# Patient Record
Sex: Female | Born: 1995 | Race: Black or African American | Hispanic: No | Marital: Single | Smoking: Current some day smoker
Health system: Southern US, Community
[De-identification: ages and names within clinical notes are randomized; demographics above are authoritative.]

## PROBLEM LIST (undated history)

## (undated) DIAGNOSIS — L309 Dermatitis, unspecified: Secondary | ICD-10-CM

## (undated) DIAGNOSIS — Z8659 Personal history of other mental and behavioral disorders: Secondary | ICD-10-CM

## (undated) DIAGNOSIS — I10 Essential (primary) hypertension: Secondary | ICD-10-CM

## (undated) HISTORY — PX: NO PAST SURGERIES: SHX2092

---
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
## 2015-02-17 ENCOUNTER — Emergency Department (HOSPITAL_COMMUNITY): Payer: No Typology Code available for payment source

## 2015-02-17 ENCOUNTER — Emergency Department (HOSPITAL_COMMUNITY)
Admission: EM | Admit: 2015-02-17 | Discharge: 2015-02-17 | Disposition: A | Discharge status: Home / Self Care | Payer: No Typology Code available for payment source | Attending: Emergency Medicine | Admitting: Emergency Medicine

## 2015-02-17 ENCOUNTER — Encounter (HOSPITAL_COMMUNITY): Payer: Self-pay | Admitting: *Deleted

## 2015-02-17 DIAGNOSIS — Z872 Personal history of diseases of the skin and subcutaneous tissue: Secondary | ICD-10-CM | POA: Diagnosis not present

## 2015-02-17 DIAGNOSIS — R059 Cough, unspecified: Secondary | ICD-10-CM

## 2015-02-17 DIAGNOSIS — Z7952 Long term (current) use of systemic steroids: Secondary | ICD-10-CM | POA: Diagnosis not present

## 2015-02-17 DIAGNOSIS — R05 Cough: Secondary | ICD-10-CM

## 2015-02-17 DIAGNOSIS — J029 Acute pharyngitis, unspecified: Secondary | ICD-10-CM | POA: Diagnosis not present

## 2015-02-17 DIAGNOSIS — F909 Attention-deficit hyperactivity disorder, unspecified type: Secondary | ICD-10-CM | POA: Insufficient documentation

## 2015-02-17 DIAGNOSIS — Z7951 Long term (current) use of inhaled steroids: Secondary | ICD-10-CM | POA: Diagnosis not present

## 2015-02-17 DIAGNOSIS — J45901 Unspecified asthma with (acute) exacerbation: Secondary | ICD-10-CM | POA: Diagnosis not present

## 2015-02-17 DIAGNOSIS — R509 Fever, unspecified: Secondary | ICD-10-CM

## 2015-02-17 LAB — RAPID STREP SCREEN (MED CTR MEBANE ONLY): Streptococcus, Group A Screen (Direct): NEGATIVE

## 2015-02-17 MED ORDER — ACETAMINOPHEN 500 MG PO TABS
1000.0000 mg | ORAL_TABLET | Freq: Once | ORAL | Status: AC
  Administered 2015-02-17: 1000 mg via ORAL
  Filled 2015-02-17: qty 2

## 2015-02-17 MED ORDER — DEXTROMETHORPHAN POLISTIREX 30 MG/5ML PO LQCR: 30.0000 mg | ORAL | Status: DC | PRN

## 2015-02-17 NOTE — Discharge Instructions (Signed)
Rapid strep and CXR were normal today. Take the prescribed medication as directed. Follow-up with your primary care physician. Return to the ED for new or worsening symptoms.

## 2015-02-17 NOTE — ED Provider Notes (Signed)
CSN: [id number]     Arrival date & time 02/17/15  0807 History   First MD Initiated Contact with Patient 02/17/15 0820     Chief Complaint  Patient presents with  . Sore Throat  . Cough     (Consider location/radiation/quality/duration/timing/severity/associated sxs/prior Treatment) Patient is a 19 y.o. female presenting with pharyngitis and cough. The history is provided by the patient and medical records.  Sore Throat Associated symptoms include congestion, coughing and a sore throat.  Cough Associated symptoms: rhinorrhea and sore throat     This is an 19 year old female with past medical history significant for asthma, eczema, ADHD, presenting to the ED for URI type symptoms. Specifically patient has had dry cough, sore throat, nasal congestion, and intermittent headaches for the past 5 days. She states her younger brother is sick at home with similar symptoms. She endorses subjective fever and chills and generalized malaise. She denies any chest pain, shortness of breath, nausea, vomiting, abdominal pain.  No intervention tried PTA.  Past Medical History  Diagnosis Date  . Asthma   . History of ADHD   . Eczema    History reviewed. No pertinent past surgical history. Family History  Problem Relation Age of Onset  . Stroke Other   . Hypertension Other   . Cancer Other   . Diabetes Other   . Hypertension Father    History  Substance Use Topics  . Smoking status: Never Smoker   . Smokeless tobacco: Not on file  . Alcohol Use: No   OB History    No data available     Review of Systems  HENT: Positive for congestion, rhinorrhea and sore throat.   Respiratory: Positive for cough.   All other systems reviewed and are negative.     Allergies  Other  Home Medications   Prior to Admission medications   Medication Sig Start Date End Date Taking? Authorizing Provider  amphetamine-dextroamphetamine (ADDERALL XR) 5 MG 24 hr capsule Take 5 mg by mouth every morning.     Historical Provider, MD  fluticasone (FLONASE) 50 MCG/ACT nasal spray Place 2 sprays into the nose daily. 03/20/12 03/20/13  Delanna Noticeonald Laney, MD  medroxyPROGESTERone (DEPO-PROVERA) 150 MG/ML injection Inject 150 mg into the muscle every 3 (three) months.    Historical Provider, MD  triamcinolone cream (KENALOG) 0.1 % Apply 1 application topically 2 (two) times daily.    Historical Provider, MD   BP 109/69 mmHg  Pulse 107  Temp(Src) 99.3 F (37.4 C) (Oral)  Resp 20  Wt 135 lb (61.236 kg)  SpO2 96%   Physical Exam  Constitutional: She is oriented to person, place, and time. She appears well-developed and well-nourished.  Non-toxic appearance. No distress.  HENT:  Head: Normocephalic and atraumatic.  Right Ear: Tympanic membrane and ear canal normal.  Left Ear: Tympanic membrane and ear canal normal.  Nose: Mucosal edema present.  Mouth/Throat: Uvula is midline and mucous membranes are normal. No oral lesions. No trismus in the jaw. Posterior oropharyngeal erythema present. No oropharyngeal exudate, posterior oropharyngeal edema or tonsillar abscesses.  Nasal congestion and PND noted; tonsils 1+ bilaterally without exudate; uvula midline without peritonsillar abscess; handling secretions appropriately; no difficulty swallowing or speaking  Eyes: Conjunctivae and EOM are normal. Pupils are equal, round, and reactive to light.  Neck: Normal range of motion.  Cardiovascular: Normal rate, regular rhythm and normal heart sounds.   Pulmonary/Chest: Effort normal and breath sounds normal. No respiratory distress. She has no wheezes. She has no  rhonchi.  Faint expiratory wheezes in right lung base; speaking in full complete sentences without difficulty  Abdominal: Soft. Bowel sounds are normal. There is no tenderness. There is no guarding.  Musculoskeletal: Normal range of motion.  Lymphadenopathy:    She has no cervical adenopathy.  Neurological: She is alert and oriented to person, place, and time.   Skin: Skin is warm and dry.  Psychiatric: She has a normal mood and affect.  Nursing note and vitals reviewed.   ED Course  Procedures (including critical care time) Labs Review Labs Reviewed  RAPID STREP SCREEN  CULTURE, GROUP A STREP    Imaging Review Dg Chest 2 View  02/17/2015   CLINICAL DATA:  Cough, sore throat, upper respiratory infection  EXAM: CHEST  2 VIEW  COMPARISON:  None.  FINDINGS: The heart size and mediastinal contours are within normal limits. Both lungs are clear. The visualized skeletal structures are unremarkable.  IMPRESSION: No active cardiopulmonary disease.   Electronically Signed   By: Natasha Mead M.D.   On: 02/17/2015 10:02     EKG Interpretation None      MDM   Final diagnoses:  Cough  Fever   19 year old female with upper respiratory symptoms for the past 5 days. On exam, patient with low-grade fever but overall nontoxic in appearance. She does have slight tonsillar erythema and postnasal drip noted. She is handling secretions well. She does have a faint expiratory wheeze in her right lower lobe, patient also has history of asthma. She is speaking in full complete sentences and is in no acute respiratory distress. Rapid strep negative, culture pending. Chest x-ray was obtained which is also negative for acute findings. Suspect viral process. Of note, patient with low-grade tachycardia, this is likely secondary to her fever. She has no chest pain or shortness of breath to suggest PE. Patient discharged home with supportive care. Encouraged close PCP follow-up.   Discussed plan with patient, he/she acknowledged understanding and agreed with plan of care.  Return precautions given for new or worsening symptoms.  Patient with increased temp at time of discharge.  She was given 1g tylenol prior to discharge.  Instructed to continue tylenol/motrin at home PRN fever.  PCP FU again encouraged.  Garlon Hatchet, PA-C 02/17/15 1049  Donnetta Hutching, MD 02/17/15  [phone]

## 2015-02-17 NOTE — ED Notes (Signed)
Declined W/C at D/C and was escorted to lobby by RN. 

## 2015-02-17 NOTE — ED Notes (Signed)
Pt reports gough ,sore throat and URI Sx's since [place].

## 2015-02-19 LAB — CULTURE, GROUP A STREP: STREP A CULTURE: NEGATIVE

## 2015-03-17 ENCOUNTER — Emergency Department (INDEPENDENT_AMBULATORY_CARE_PROVIDER_SITE_OTHER)
Admission: EM | Admit: 2015-03-17 | Discharge: 2015-03-17 | Disposition: A | Discharge status: Home / Self Care | Payer: Medicaid Other | Source: Home / Self Care | Attending: Family Medicine | Admitting: Family Medicine

## 2015-03-17 DIAGNOSIS — L02214 Cutaneous abscess of groin: Secondary | ICD-10-CM

## 2015-03-17 MED ORDER — SULFAMETHOXAZOLE-TRIMETHOPRIM 800-160 MG PO TABS: 2.0000 | ORAL_TABLET | Freq: Two times a day (BID) | ORAL | Status: DC

## 2015-03-17 NOTE — Discharge Instructions (Signed)
Thank you for coming in today.  Abscess Care After An abscess (also called a boil or furuncle) is an infected area that contains a collection of pus. Signs and symptoms of an abscess include pain, tenderness, redness, or hardness, or you may feel a moveable soft area under your skin. An abscess can occur anywhere in the body. The infection may spread to surrounding tissues causing cellulitis. A cut (incision) by the surgeon was made over your abscess and the pus was drained out. Gauze may have been packed into the space to provide a drain that will allow the cavity to heal from the inside outwards. The boil may be painful for 5 to 7 days. Most people with a boil do not have high fevers. Your abscess, if seen early, may not have localized, and may not have been lanced. If not, another appointment may be required for this if it does not get better on its own or with medications. HOME CARE INSTRUCTIONS   Only take over-the-counter or prescription medicines for pain, discomfort, or fever as directed by your caregiver.  When you bathe, soak and then remove gauze or iodoform packs at least daily or as directed by your caregiver. You may then wash the wound gently with mild soapy water. Repack with gauze or do as your caregiver directs. SEEK IMMEDIATE MEDICAL CARE IF:   You develop increased pain, swelling, redness, drainage, or bleeding in the wound site.  You develop signs of generalized infection including muscle aches, chills, fever, or a general ill feeling.  An oral temperature above 102 F (38.9 C) develops, not controlled by medication. See your caregiver for a recheck if you develop any of the symptoms described above. If medications (antibiotics) were prescribed, take them as directed. Document Released: 06/28/2005 Document Revised: 03/03/2012 Document Reviewed: 02/23/2008 ExitCare Patient Information 2015 [employer]. This information is not intended to replace advice given to you by your  health care provider. Make sure you discuss any questions you have with your health care provider.  

## 2015-03-17 NOTE — ED Provider Notes (Signed)
Regina Mckee is a 19 y.o. female who presents to Urgent Care today for abscess. Patient has a tender painful area in her left pubic area that has been present for about 4 days and is worsening. Symptoms started after patient shaved her pubic hair. No fevers or chills nausea vomiting or diarrhea.   Patient uses Depo-Provera for birth control. Past Medical History  Diagnosis Date  . Asthma   . History of ADHD   . Eczema    No past surgical history on file. History  Substance Use Topics  . Smoking status: Never Smoker   . Smokeless tobacco: Not on file  . Alcohol Use: No   ROS as above Medications: No current facility-administered medications for this encounter.   Current Outpatient Prescriptions  Medication Sig Dispense Refill  . amphetamine-dextroamphetamine (ADDERALL XR) 5 MG 24 hr capsule Take 5 mg by mouth every morning.    Marland Kitchen. dextromethorphan (DELSYM) 30 MG/5ML liquid Take 5 mLs (30 mg total) by mouth as needed for cough. 89 mL 0  . fluticasone (FLONASE) 50 MCG/ACT nasal spray Place 2 sprays into the nose daily. 16 g 2  . medroxyPROGESTERone (DEPO-PROVERA) 150 MG/ML injection Inject 150 mg into the muscle every 3 (three) months.    . sulfamethoxazole-trimethoprim (SEPTRA DS) 800-160 MG per tablet Take 2 tablets by mouth 2 (two) times daily. 28 tablet 0  . triamcinolone cream (KENALOG) 0.1 % Apply 1 application topically 2 (two) times daily.     Allergies  Allergen Reactions  . Other Itching    Cherries cause throat to itch.     Exam:  BP 116/80 mmHg  Pulse 84  Temp(Src) 98.2 F (36.8 C) (Oral)  Resp 18  SpO2 97% Gen: Well NAD Skin: Pubic mount tender erythematous indurated area left pubic mount with central fluctuance. Approximate 4 cm in diameter.   Abscess incision and drainage. Consent obtained and timeout performed. Skin cleaned with alcohol, and cold spray applied. 7 mL of lidocaine with epinephrine injected achieving good anesthesia. Skin was  again cleaned with alcohol. A sharp incision was made to the area of fluctuance. The incision was widened and pus was expressed. Pus was cultured. Blunt dissection was used to break up loculations. Further pus was expressed. Patient tolerated the procedure well. A dressing was applied  No results found for this or any previous visit (from the past 24 hour(s)). No results found.  Assessment and Plan: 19 y.o. female with abscess incised and drained. Culture pending. Treat with Bactrim.  Discussed warning signs or symptoms. Please see discharge instructions. Patient expresses understanding.     Rodolph BongEvan S Leeah Politano, MD 03/17/15 [phone]

## 2015-03-17 NOTE — ED Notes (Signed)
Reports having an abscess on her vagina x 4 days.  Pain and swelling.  No drainage.

## 2015-03-18 ENCOUNTER — Encounter (HOSPITAL_COMMUNITY): Payer: Self-pay | Admitting: Emergency Medicine

## 2015-03-18 ENCOUNTER — Emergency Department (HOSPITAL_COMMUNITY)
Admission: EM | Admit: 2015-03-18 | Discharge: 2015-03-18 | Disposition: A | Discharge status: Home / Self Care | Payer: Medicaid Other | Attending: Emergency Medicine | Admitting: Emergency Medicine

## 2015-03-18 DIAGNOSIS — L02214 Cutaneous abscess of groin: Secondary | ICD-10-CM | POA: Insufficient documentation

## 2015-03-18 DIAGNOSIS — J45909 Unspecified asthma, uncomplicated: Secondary | ICD-10-CM | POA: Insufficient documentation

## 2015-03-18 DIAGNOSIS — N764 Abscess of vulva: Secondary | ICD-10-CM | POA: Diagnosis present

## 2015-03-18 DIAGNOSIS — L02215 Cutaneous abscess of perineum: Secondary | ICD-10-CM | POA: Diagnosis not present

## 2015-03-18 DIAGNOSIS — F909 Attention-deficit hyperactivity disorder, unspecified type: Secondary | ICD-10-CM | POA: Insufficient documentation

## 2015-03-18 DIAGNOSIS — Z79899 Other long term (current) drug therapy: Secondary | ICD-10-CM | POA: Diagnosis not present

## 2015-03-18 DIAGNOSIS — Z792 Long term (current) use of antibiotics: Secondary | ICD-10-CM | POA: Insufficient documentation

## 2015-03-18 DIAGNOSIS — Z7951 Long term (current) use of inhaled steroids: Secondary | ICD-10-CM | POA: Diagnosis not present

## 2015-03-18 MED ORDER — OXYCODONE-ACETAMINOPHEN 5-325 MG PO TABS: 1.0000 | ORAL_TABLET | ORAL | Status: DC | PRN

## 2015-03-18 MED ORDER — CEPHALEXIN 500 MG PO CAPS: 500.0000 mg | ORAL_CAPSULE | Freq: Four times a day (QID) | ORAL | Status: DC

## 2015-03-18 MED ORDER — CEPHALEXIN 250 MG PO CAPS
500.0000 mg | ORAL_CAPSULE | Freq: Once | ORAL | Status: AC
  Administered 2015-03-18: 500 mg via ORAL
  Filled 2015-03-18: qty 2

## 2015-03-18 MED ORDER — OXYCODONE-ACETAMINOPHEN 5-325 MG PO TABS
1.0000 | ORAL_TABLET | Freq: Once | ORAL | Status: AC
  Administered 2015-03-18: 1 via ORAL
  Filled 2015-03-18: qty 1

## 2015-03-18 NOTE — Discharge Instructions (Signed)
Take the prescribed medication as directed in addition to bactrim given yesterday. Recommend warm compresses at home to help aid drainage and healing. Monitor area closely and if no improvement in the next few days, recommend [facility] contact info provided. Return to the ED for new or worsening symptoms.

## 2015-03-18 NOTE — ED Notes (Signed)
[facility], pt has taken x 2 doses of Sulfameth/Trimethoprim 800/160mg 

## 2015-03-18 NOTE — ED Notes (Signed)
Patient states labial abscess L side.  Patient states was drained yesterday afternoon.   Patient states is still draining and having more swelling and pain in the area.

## 2015-03-18 NOTE — ED Provider Notes (Signed)
CSN: [id number]     Arrival date & time 03/18/15  1040 History   First MD Initiated Contact with Patient 03/18/15 1051     Chief Complaint  Patient presents with  . Abscess    labial     (Consider location/radiation/quality/duration/timing/severity/associated sxs/prior Treatment) Patient is a 19 y.o. female presenting with abscess. The history is provided by the patient and medical records.  Abscess   This is an 19 year old female with past medical history significant for asthma, ADHD, eczema, presenting to the ED for wound check. Patient was evaluated at urgent care yesterday and had left labial abscess drained. She states procedure went well and she was started on Bactrim, she has taken 2 doses thus far. States today area is very painful to touch and feels swollen. Abscess has continued draining purulent fluid.  She denies any fever or chills.  No urinary symptoms or vaginal discharge.  Past Medical History  Diagnosis Date  . Asthma   . History of ADHD   . Eczema    History reviewed. No pertinent past surgical history. Family History  Problem Relation Age of Onset  . Stroke Other   . Hypertension Other   . Cancer Other   . Diabetes Other   . Hypertension Father    History  Substance Use Topics  . Smoking status: Never Smoker   . Smokeless tobacco: Not on file  . Alcohol Use: No   OB History    No data available     Review of Systems  Genitourinary:       Labial abscess  All other systems reviewed and are negative.     Allergies  Other  Home Medications   Prior to Admission medications   Medication Sig Start Date End Date Taking? Authorizing Provider  amphetamine-dextroamphetamine (ADDERALL XR) 5 MG 24 hr capsule Take 5 mg by mouth every morning.   Yes Historical Provider, MD  dextromethorphan 15 MG/5ML syrup Take 10 mLs by mouth 4 (four) times daily as needed for cough.   Yes Historical Provider, MD  naproxen sodium (ANAPROX) 220 MG tablet Take 220 mg by  mouth 2 (two) times daily as needed (pain).   Yes Historical Provider, MD  sulfamethoxazole-trimethoprim (SEPTRA DS) 800-160 MG per tablet Take 2 tablets by mouth 2 (two) times daily. 03/17/15  Yes Rodolph Bong, MD  triamcinolone cream (KENALOG) 0.1 % Apply 1 application topically 2 (two) times daily.   Yes Historical Provider, MD  dextromethorphan (DELSYM) 30 MG/5ML liquid Take 5 mLs (30 mg total) by mouth as needed for cough. 02/17/15   Garlon Hatchet, PA-C  fluticasone (FLONASE) 50 MCG/ACT nasal spray Place 2 sprays into the nose daily. 03/20/12 03/20/13  Delanna Notice, MD  medroxyPROGESTERone (DEPO-PROVERA) 150 MG/ML injection Inject 150 mg into the muscle every 3 (three) months.    Historical Provider, MD   BP 122/79 mmHg  Pulse 104  Temp(Src) 98.4 F (36.9 C) (Oral)  Resp 18  Ht  (1.6 m)  Wt 135 lb (61.236 kg)  BMI 23.92 kg/m2  SpO2 100%   Physical Exam  Constitutional: She is oriented to person, place, and time. She appears well-developed and well-nourished. No distress.  HENT:  Head: Normocephalic and atraumatic.  Mouth/Throat: Oropharynx is clear and moist.  Eyes: Conjunctivae and EOM are normal. Pupils are equal, round, and reactive to light.  Neck: Normal range of motion. Neck supple.  Cardiovascular: Normal rate, regular rhythm and normal heart sounds.   Pulmonary/Chest: Effort normal and  breath sounds normal. No respiratory distress. She has no wheezes.  Genitourinary:  Left mons pubis with abscess with incision present, purulent drainage noted; there is surrounding erythema and induration consistent with cellulitis with extension to left labia; area is locally TTP  Musculoskeletal: Normal range of motion.  Neurological: She is alert and oriented to person, place, and time.  Skin: Skin is warm and dry. She is not diaphoretic.  Psychiatric: She has a normal mood and affect.  Nursing note and vitals reviewed.   ED Course  Procedures (including critical care time) Labs  Review Labs Reviewed - No data to display  Imaging Review No results found.   EKG Interpretation None      MDM   Final diagnoses:  Groin abscess   19 year old female here with left groin abscess that [facility]  Returns today due to increased pain.  On exam, abscess of left mons pubis freely draining purulent material.  There is surrounding erythema and induration consistent with cellulitis which extends to her left labia.  Preliminary culture results reviewed, gram-positive cocci in chains and clusters. This is likely staph. Sensitivity report not available at this time. Patient has taken 2 doses of Bactrim thus far. She remained afebrile and nontoxic in appearance. Will add keflex to broaden coverage as well as pain meds.  Encouraged warm compresses at home, monitor symptoms closely and if no significant improvement in the next 48 hours recommended [facility].  Discussed plan with patient, he/she acknowledged understanding and agreed with plan of care.  Return precautions given for new or worsening symptoms.  Garlon HatchetLisa M Bates [place], PA-C 03/18/15 1306  Samuel JesterKathleen McManus, DO 03/21/15 [phone]

## 2015-03-20 ENCOUNTER — Inpatient Hospital Stay (HOSPITAL_COMMUNITY)
Admission: AD | Admit: 2015-03-20 | Discharge: 2015-03-20 | Disposition: A | Discharge status: Home / Self Care | Payer: Medicaid Other | Source: Ambulatory Visit | Attending: Family Medicine | Admitting: Family Medicine

## 2015-03-20 ENCOUNTER — Encounter (HOSPITAL_COMMUNITY): Payer: Self-pay | Admitting: *Deleted

## 2015-03-20 DIAGNOSIS — N764 Abscess of vulva: Secondary | ICD-10-CM

## 2015-03-20 DIAGNOSIS — F909 Attention-deficit hyperactivity disorder, unspecified type: Secondary | ICD-10-CM | POA: Diagnosis not present

## 2015-03-20 DIAGNOSIS — Z79899 Other long term (current) drug therapy: Secondary | ICD-10-CM | POA: Diagnosis not present

## 2015-03-20 DIAGNOSIS — R1030 Lower abdominal pain, unspecified: Secondary | ICD-10-CM | POA: Diagnosis present

## 2015-03-20 LAB — CULTURE, ROUTINE-ABSCESS

## 2015-03-20 NOTE — Discharge Instructions (Signed)

## 2015-03-20 NOTE — MAU Provider Note (Signed)
History     CSN: [id number]  Arrival date and time: 03/20/15 [id number]   First Provider Initiated Contact with Patient 03/20/15 [phone]      No chief complaint on file.  Groin Pain The patient's primary symptoms include genital lesions. This is a recurrent problem. The current episode started in the past 7 days. The problem occurs constantly. The problem has been gradually worsening. The pain is moderate. The problem affects the left side. She is not pregnant.    19 y.o. G0P0 presents to the MAU with  swollen and painful left labia that was I & D on 3/24. She is currently taking Bactrim and Keflex. She states that the abcess has been draining until yesterday.    Past Medical History  Diagnosis Date  . Asthma   . History of ADHD   . Eczema     Past Surgical History  Procedure Laterality Date  . No past surgeries      Family History  Problem Relation Age of Onset  . Stroke Other   . Hypertension Other   . Cancer Other   . Diabetes Other   . Hypertension Father     History  Substance Use Topics  . Smoking status: Never Smoker   . Smokeless tobacco: Not on file  . Alcohol Use: No    Allergies:  Allergies  Allergen Reactions  . Other Itching    Cherries cause throat to itch.    Prescriptions prior to admission  Medication Sig Dispense Refill Last Dose  . amphetamine-dextroamphetamine (ADDERALL XR) 5 MG 24 hr capsule Take 5 mg by mouth every morning.   03/17/2015 at Unknown time  . cephALEXin (KEFLEX) 500 MG capsule Take 1 capsule (500 mg total) by mouth 4 (four) times daily. 40 capsule 0   . dextromethorphan (DELSYM) 30 MG/5ML liquid Take 5 mLs (30 mg total) by mouth as needed for cough. 89 mL 0   . dextromethorphan 15 MG/5ML syrup Take 10 mLs by mouth 4 (four) times daily as needed for cough.   Past Week at Unknown time  . fluticasone (FLONASE) 50 MCG/ACT nasal spray Place 2 sprays into the nose daily. 16 g 2   . medroxyPROGESTERone (DEPO-PROVERA) 150 MG/ML injection  Inject 150 mg into the muscle every 3 (three) months.   [facility]  . naproxen sodium (ANAPROX) 220 MG tablet Take 220 mg by mouth 2 (two) times daily as needed (pain).   03/17/2015 at Unknown time  . oxyCODONE-acetaminophen (PERCOCET/ROXICET) 5-325 MG per tablet Take 1 tablet by mouth every 4 (four) hours as needed. 15 tablet 0   . sulfamethoxazole-trimethoprim (SEPTRA DS) 800-160 MG per tablet Take 2 tablets by mouth 2 (two) times daily. 28 tablet 0 03/18/2015 at Unknown time  . triamcinolone cream (KENALOG) 0.1 % Apply 1 application topically 2 (two) times daily.   03/17/2015 at Unknown time    Review of Systems  Constitutional: Negative.   HENT: Negative.   Eyes: Negative.   Respiratory: Negative.   Cardiovascular: Negative.   Gastrointestinal: Negative.   Genitourinary:       Left labial swelling and pain  Musculoskeletal: Negative.   Skin: Negative.   Neurological: Negative.   Endo/Heme/Allergies: Negative.   Psychiatric/Behavioral: Negative.    Physical Exam   Blood pressure 118/71, pulse 94, temperature 98.4 F (36.9 C), temperature source Oral, height 5\' 3"  (1.6 m), weight 58.514 kg (129 lb), last menstrual period 03/13/2015.  Physical Exam  Constitutional: She is oriented to person, place,  and time. She appears well-developed and well-nourished. No distress.  HENT:  Head: Normocephalic and atraumatic.  Cardiovascular: Normal rate.   Respiratory: Effort normal. No respiratory distress.  GI: Soft. She exhibits no distension and no mass. There is no tenderness. There is no rebound and no guarding. Hernia confirmed negative in the right inguinal area and confirmed negative in the left inguinal area.  Genitourinary:    There is tenderness and lesion on the left labia.  Musculoskeletal: Normal range of motion.  Lymphadenopathy:       Right: No inguinal adenopathy present.       Left: No inguinal adenopathy present.  Neurological: She is alert and oriented to person, place,  and time.  Skin: Skin is warm and dry.  Psychiatric: She has a normal mood and affect. Her behavior is normal. Judgment and thought content normal.    MAU Course  Procedures  MDM Vaginal inspection and Exam Consulted Dr. Shawnie Pons re POC- Unable to drain   Assessment and Plan  Left Labial Abcess  Continue ABX Keflex and Bactrim  warm compresses to affected area Advised not to shave Discharge to home  [facility] 03/20/2015, 9:31 AM

## 2015-03-20 NOTE — MAU Note (Signed)
Pt. Had an abscess on the side of the vagina so they went to urgent care on Thursday and they drained it and was sent home on antibiotics.  The next day it was was still very swollen so they went to the ER and sent she was sent home with another antibiotic.  Today she states that it is swollen and very painful.

## 2015-03-21 ENCOUNTER — Telehealth (HOSPITAL_COMMUNITY): Payer: Self-pay | Admitting: *Deleted

## 2015-03-21 NOTE — ED Notes (Addendum)
Abscess culture labia: Mod. MRSA.  Pt. adequately treated with Bactrim DS and also got Keflex form ED the next day. Discussed with Regina Mckee and she said she could stop the Keflex.  I called and Mom said she would give her the message to call tomorrow.  Call 1. Regina Mckee, Regina Mckee 03/21/2015 Left message.  Call 2. 03/22/2015 I called pt. Pt. verified x 2 and given result.  Pt. told she is adequately treated with Bactrim DS and she can stop the Keflex.  [facility] MRSA instructions with her. Pt. voiced understanding. Regina Mckee, [name] 03/25/2015

## 2015-12-18 ENCOUNTER — Encounter (HOSPITAL_COMMUNITY): Payer: Self-pay | Admitting: Vascular Surgery

## 2015-12-18 ENCOUNTER — Emergency Department (HOSPITAL_COMMUNITY)
Admission: EM | Admit: 2015-12-18 | Discharge: 2015-12-18 | Disposition: A | Discharge status: Home / Self Care | Payer: Medicaid Other | Attending: Emergency Medicine | Admitting: Emergency Medicine

## 2015-12-18 DIAGNOSIS — Z8659 Personal history of other mental and behavioral disorders: Secondary | ICD-10-CM | POA: Insufficient documentation

## 2015-12-18 DIAGNOSIS — Z872 Personal history of diseases of the skin and subcutaneous tissue: Secondary | ICD-10-CM | POA: Insufficient documentation

## 2015-12-18 DIAGNOSIS — Z792 Long term (current) use of antibiotics: Secondary | ICD-10-CM | POA: Insufficient documentation

## 2015-12-18 DIAGNOSIS — Z79899 Other long term (current) drug therapy: Secondary | ICD-10-CM | POA: Insufficient documentation

## 2015-12-18 DIAGNOSIS — J069 Acute upper respiratory infection, unspecified: Secondary | ICD-10-CM | POA: Insufficient documentation

## 2015-12-18 DIAGNOSIS — J45909 Unspecified asthma, uncomplicated: Secondary | ICD-10-CM | POA: Insufficient documentation

## 2015-12-18 DIAGNOSIS — B9789 Other viral agents as the cause of diseases classified elsewhere: Secondary | ICD-10-CM

## 2015-12-18 MED ORDER — HYDROCODONE-HOMATROPINE 5-1.5 MG/5ML PO SYRP
5.0000 mL | ORAL_SOLUTION | Freq: Once | ORAL | Status: AC
  Administered 2015-12-18: 5 mL via ORAL
  Filled 2015-12-18: qty 5

## 2015-12-18 MED ORDER — BENZONATATE 100 MG PO CAPS: 100.0000 mg | ORAL_CAPSULE | Freq: Three times a day (TID) | ORAL | Status: DC

## 2015-12-18 MED ORDER — FLUTICASONE PROPIONATE 50 MCG/ACT NA SUSP
2.0000 | Freq: Every day | NASAL | Status: DC
Start: 2015-12-18 — End: 2015-12-29

## 2015-12-18 NOTE — Discharge Instructions (Signed)
1. Medications: flonase, mucinex, tessalon, Zyrtec, home albuterol rescue inhaler as needed, usual home medications 2. Treatment: rest, drink plenty of fluids, take tylenol or ibuprofen for fever control 3. Follow Up: Please followup with your primary doctor in 3 days for discussion of your diagnoses and further evaluation after today's visit; if you do not have a primary care doctor use the resource guide provided to find one; Return to the ER for high fevers, difficulty breathing or other concerning symptoms   Upper Respiratory Infection, Adult Most upper respiratory infections (URIs) are a viral infection of the air passages leading to the lungs. A URI affects the nose, throat, and upper air passages. The most common type of URI is nasopharyngitis and is typically referred to as "the common cold." URIs run their course and usually go away on their own. Most of the time, a URI does not require medical attention, but sometimes a bacterial infection in the upper airways can follow a viral infection. This is called a secondary infection. Sinus and middle ear infections are common types of secondary upper respiratory infections. Bacterial pneumonia can also complicate a URI. A URI can worsen asthma and chronic obstructive pulmonary disease (COPD). Sometimes, these complications can require emergency medical care and may be life threatening.  CAUSES Almost all URIs are caused by viruses. A virus is a type of germ and can spread from one person to another.  RISKS FACTORS You may be at risk for a URI if:   You smoke.   You have chronic heart or lung disease.  You have a weakened defense (immune) system.   You are very young or very old.   You have nasal allergies or asthma.  You work in crowded or poorly ventilated areas.  You work in health care facilities or schools. SIGNS AND SYMPTOMS  Symptoms typically develop 2-3 days after you come in contact with a cold virus. Most viral URIs last  7-10 days. However, viral URIs from the influenza virus (flu virus) can last 14-18 days and are typically more severe. Symptoms may include:   Runny or stuffy (congested) nose.   Sneezing.   Cough.   Sore throat.   Headache.   Fatigue.   Fever.   Loss of appetite.   Pain in your forehead, behind your eyes, and over your cheekbones (sinus pain).  Muscle aches.  DIAGNOSIS  Your health care provider may diagnose a URI by:  Physical exam.  Tests to check that your symptoms are not due to another condition such as:  Strep throat.  Sinusitis.  Pneumonia.  Asthma. TREATMENT  A URI goes away on its own with time. It cannot be cured with medicines, but medicines may be prescribed or recommended to relieve symptoms. Medicines may help:  Reduce your fever.  Reduce your cough.  Relieve nasal congestion. HOME CARE INSTRUCTIONS   Take medicines only as directed by your health care provider.   Gargle warm saltwater or take cough drops to comfort your throat as directed by your health care provider.  Use a warm mist humidifier or inhale steam from a shower to increase air moisture. This may make it easier to breathe.  Drink enough fluid to keep your urine clear or pale yellow.   Eat soups and other clear broths and maintain good nutrition.   Rest as needed.   Return to work when your temperature has returned to normal or as your health care provider advises. You may need to stay home longer to  avoid infecting others. You can also use a face mask and careful hand washing to prevent spread of the virus.  Increase the usage of your inhaler if you have asthma.   Do not use any tobacco products, including cigarettes, chewing tobacco, or electronic cigarettes. If you need help quitting, ask your health care provider. PREVENTION  The best way to protect yourself from getting a cold is to practice good hygiene.   Avoid oral or hand contact with people with cold  symptoms.   Wash your hands often if contact occurs.  There is no clear evidence that vitamin C, vitamin E, echinacea, or exercise reduces the chance of developing a cold. However, it is always recommended to get plenty of rest, exercise, and practice good nutrition.  SEEK MEDICAL CARE IF:   You are getting worse rather than better.   Your symptoms are not controlled by medicine.   You have chills.  You have worsening shortness of breath.  You have brown or red mucus.  You have yellow or brown nasal discharge.  You have pain in your face, especially when you bend forward.  You have a fever.  You have swollen neck glands.  You have pain while swallowing.  You have white areas in the back of your throat. SEEK IMMEDIATE MEDICAL CARE IF:   You have severe or persistent:  Headache.  Ear pain.  Sinus pain.  Chest pain.  You have chronic lung disease and any of the following:  Wheezing.  Prolonged cough.  Coughing up blood.  A change in your usual mucus.  You have a stiff neck.  You have changes in your:  Vision.  Hearing.  Thinking.  Mood. MAKE SURE YOU:   Understand these instructions.  Will watch your condition.  Will get help right away if you are not doing well or get worse.   This information is not intended to replace advice given to you by your health care provider. Make sure you discuss any questions you have with your health care provider.   Document Released: 06/05/2001 Document Revised: 04/26/2015 Document Reviewed: 03/17/2014 Elsevier Interactive Patient Education [employer]2016 Elsevier Inc.

## 2015-12-18 NOTE — ED Provider Notes (Signed)
CSN: [id number]     Arrival date & time 12/18/15  1325 History  By signing my name below, I, Octavia Heirrianna Nassar, attest that this documentation has been prepared under the direction and in the presence of TX[employer] CorpHannah Mistie Adney, PA-C. Electronically Signed: Octavia HeirArianna Nassar, ED Scribe. 12/18/2015. 3:11 PM.     No chief complaint on file.     The history is provided by the patient. No language interpreter was used.   HPI Comments: Regina Mckee is a 19 y.o. female who has a hx of asthma presents to the Emergency Department complaining of constant, gradual worsening sore throat with associated headache onset one week ago. Pt has been having associated productive cough with yellow sputum, body aches, difficulty sleeping, congestion and subjective fever. 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 She took multiple cough suppressants and OTC medication to alleviate her symptoms with no relief. Pt denies ear pain and shortness of breath.  Past Medical History  Diagnosis Date  . Asthma   . History of ADHD   . Eczema    Past Surgical History  Procedure Laterality Date  . No past surgeries     Family History  Problem Relation Age of Onset  . Stroke Other   . Hypertension Other   . Cancer Other   . Diabetes Other   . Hypertension Father    Social History  Substance Use Topics  . Smoking status: Never Smoker   . Smokeless tobacco: None  . Alcohol Use: No   OB History    No data available     Review of Systems  Constitutional: Positive for fever ( tactile and subjective) and fatigue. Negative for chills and appetite change.  HENT: Positive for congestion, postnasal drip, rhinorrhea, sinus pressure and sore throat. Negative for ear discharge, ear pain and mouth sores.   Eyes: Negative for visual disturbance.  Respiratory: Positive for cough. Negative for chest tightness, shortness of breath, wheezing and stridor.   Cardiovascular: Negative for  chest pain, palpitations and leg swelling.  Gastrointestinal: Negative for nausea, vomiting, abdominal pain and diarrhea.  Genitourinary: Negative for dysuria, urgency, frequency and hematuria.  Musculoskeletal: Negative for myalgias, back pain, arthralgias and neck stiffness.  Skin: Negative for rash.  Neurological: Positive for headaches. Negative for syncope, light-headedness and numbness.  Hematological: Negative for adenopathy.  Psychiatric/Behavioral: The patient is not nervous/anxious.   All other systems reviewed and are negative.     Allergies  Other  Home Medications   Prior to Admission medications   Medication Sig Start Date End Date Taking? Authorizing Provider  amphetamine-dextroamphetamine (ADDERALL XR) 5 MG 24 hr capsule Take 5 mg by mouth every morning.    Historical Provider, MD  benzonatate (TESSALON) 100 MG capsule Take 1 capsule (100 mg total) by mouth every 8 (eight) hours. 12/18/15   Kort Stettler, PA-C  cephALEXin (KEFLEX) 500 MG capsule Take 1 capsule (500 mg total) by mouth 4 (four) times daily. 03/18/15   Garlon HatchetLisa M Sanders, PA-C  fluticasone (FLONASE) 50 MCG/ACT nasal spray Place 2 sprays into both nostrils daily. 12/18/15   Miosha Behe, PA-C  medroxyPROGESTERone (DEPO-PROVERA) 150 MG/ML injection Inject 150 mg into the muscle every 3 (three) months.    Historical Provider, MD  naproxen sodium (ANAPROX) 220 MG tablet Take 220 mg by mouth 2 (two) times daily as needed (pain).    Historical Provider, MD  oxyCODONE-acetaminophen (PERCOCET/ROXICET) 5-325 MG per tablet Take 1 tablet by mouth every 4 (four)  hours as needed. 03/18/15   Garlon Hatchet, PA-C  sulfamethoxazole-trimethoprim (SEPTRA DS) 800-160 MG per tablet Take 2 tablets by mouth 2 (two) times daily. 03/17/15   Rodolph Bong, MD   Triage vitals: BP 129/88 mmHg  Pulse 84  Temp(Src) 98.5 F (36.9 C) (Oral)  Resp 16  SpO2 97% Physical Exam  Constitutional: She is oriented to person, place, and  time. She appears well-developed and well-nourished. No distress.  HENT:  Head: Normocephalic and atraumatic.  Right Ear: Tympanic membrane, external ear and ear canal normal.  Left Ear: Tympanic membrane, external ear and ear canal normal.  Nose: Mucosal edema and rhinorrhea present. No epistaxis. Right sinus exhibits no maxillary sinus tenderness and no frontal sinus tenderness. Left sinus exhibits no maxillary sinus tenderness and no frontal sinus tenderness.  Mouth/Throat: Uvula is midline and mucous membranes are normal. Mucous membranes are not pale and not cyanotic. No oropharyngeal exudate, posterior oropharyngeal edema, posterior oropharyngeal erythema or tonsillar abscesses.  Eyes: Conjunctivae are normal. Pupils are equal, round, and reactive to light.  Neck: Normal range of motion and full passive range of motion without pain.  Cardiovascular: Normal rate and intact distal pulses.   Pulmonary/Chest: Effort normal and breath sounds normal. No stridor.  Clear and equal breath sounds without focal wheezes, rhonchi, rales  Abdominal: Soft. Bowel sounds are normal. There is no tenderness.  Musculoskeletal: Normal range of motion.  Lymphadenopathy:    She has no cervical adenopathy.  Neurological: She is alert and oriented to person, place, and time.  Skin: Skin is warm and dry. No rash noted. She is not diaphoretic.  Psychiatric: She has a normal mood and affect.  Nursing note and vitals reviewed.   ED Course  Procedures  DIAGNOSTIC STUDIES: Oxygen Saturation is 97% on RA, normal by my interpretation.  COORDINATION OF CARE:  2:56 PM Discussed treatment plan which includes inhaler, mucinex, tessalon with pt at bedside and pt agreed to plan.   MDM   Final diagnoses:  Viral URI with cough   Regina Mckee presents with uri symptoms.   Patient is afebrile without tachycardia, wheezing or shortness of breath. Highly doubt pneumonia. No indication for  chest x-ray at  this time.   Patients symptoms are consistent with URI, likely viral etiology. Discussed that antibiotics are not indicated for viral infections. Pt will be discharged with symptomatic treatment.  Verbalizes understanding and is agreeable with plan. Pt is hemodynamically stable & in NAD prior to dc.  BP 114/77 mmHg  Pulse 73  Temp(Src) 98 F (36.7 C) (Oral)  Resp 18  SpO2 99%  I personally performed the services described in this documentation, which was scribed in my presence. The recorded information has been reviewed and is accurate.    Dahlia Client Lajeana Strough, PA-C 12/18/15 1600  Donnetta Hutching, MD 12/19/15 [phone]

## 2015-12-18 NOTE — ED Notes (Signed)
Pt reports to the ED for eval of URI symptoms x 1 week. She reports she has had nasal congestion, drainage, sore throat, dry cough, and sinus pressure. She reports chills but unsure of fever. Pt denies any N/V/D. PT A&OX4, respe/u, and skin warm and dry.

## 2015-12-21 ENCOUNTER — Emergency Department (HOSPITAL_COMMUNITY)
Admission: EM | Admit: 2015-12-21 | Discharge: 2015-12-21 | Disposition: A | Discharge status: Home / Self Care | Payer: Medicaid Other | Attending: Emergency Medicine | Admitting: Emergency Medicine

## 2015-12-21 ENCOUNTER — Encounter (HOSPITAL_COMMUNITY): Payer: Self-pay | Admitting: Emergency Medicine

## 2015-12-21 DIAGNOSIS — H109 Unspecified conjunctivitis: Secondary | ICD-10-CM | POA: Insufficient documentation

## 2015-12-21 DIAGNOSIS — J069 Acute upper respiratory infection, unspecified: Secondary | ICD-10-CM | POA: Insufficient documentation

## 2015-12-21 DIAGNOSIS — J45909 Unspecified asthma, uncomplicated: Secondary | ICD-10-CM | POA: Insufficient documentation

## 2015-12-21 DIAGNOSIS — Z8659 Personal history of other mental and behavioral disorders: Secondary | ICD-10-CM | POA: Insufficient documentation

## 2015-12-21 DIAGNOSIS — Z7951 Long term (current) use of inhaled steroids: Secondary | ICD-10-CM | POA: Insufficient documentation

## 2015-12-21 DIAGNOSIS — Z79899 Other long term (current) drug therapy: Secondary | ICD-10-CM | POA: Insufficient documentation

## 2015-12-21 DIAGNOSIS — Z872 Personal history of diseases of the skin and subcutaneous tissue: Secondary | ICD-10-CM | POA: Insufficient documentation

## 2015-12-21 MED ORDER — ERYTHROMYCIN 5 MG/GM OP OINT: 1.0000 "application " | TOPICAL_OINTMENT | Freq: Four times a day (QID) | OPHTHALMIC | Status: DC

## 2015-12-21 NOTE — ED Notes (Signed)
Patient d/c'd selfcare.  F/U and medications discussed.  Patient and family member verbalized understanding. 

## 2015-12-21 NOTE — ED Provider Notes (Signed)
CSN: [id number]     Arrival date & time 12/21/15  4098 History   First MD Initiated Contact with Patient 12/21/15 [phone]     Chief Complaint  Patient presents with  . Cough  . Conjunctivitis     (Consider location/radiation/quality/duration/timing/severity/associated sxs/prior Treatment) HPI Comments: 19 year old female with a past medical history of mild intermittent asthma who presents with cough, nasal congestion, and red eyes. Patient states that for 4 days she has had a cough associated with nasal congestion. She had a fever a few days ago but is no longer running fevers and denies any vomiting or diarrhea. She has also had sore throat. Yesterday she developed eye redness that was initially in one eye but is now on both with some clear discharge from both eyes. She denies any sick contacts. No chest pain or shortness of breath, no wheezing.  Patient is a 19 y.o. female presenting with cough and conjunctivitis. The history is provided by the patient.  Cough Conjunctivitis    Past Medical History  Diagnosis Date  . Asthma   . History of ADHD   . Eczema    Past Surgical History  Procedure Laterality Date  . No past surgeries     Family History  Problem Relation Age of Onset  . Stroke Other   . Hypertension Other   . Cancer Other   . Diabetes Other   . Hypertension Father    Social History  Substance Use Topics  . Smoking status: Never Smoker   . Smokeless tobacco: None  . Alcohol Use: No   OB History    No data available     Review of Systems  Respiratory: Positive for cough.     10 Systems reviewed and are negative for acute change except as noted in the HPI.   Allergies  Other  Home Medications   Prior to Admission medications   Medication Sig Start Date End Date Taking? Authorizing Provider  benzonatate (TESSALON) 100 MG capsule Take 1 capsule (100 mg total) by mouth every 8 (eight) hours. 12/18/15  Yes Hannah Muthersbaugh, PA-C  fluticasone (FLONASE)  50 MCG/ACT nasal spray Place 2 sprays into both nostrils daily. 12/18/15  Yes Hannah Muthersbaugh, PA-C  guaiFENesin (MUCINEX) 600 MG 12 hr tablet Take 600 mg by mouth 2 (two) times daily.   Yes Historical Provider, MD  guaifenesin (ROBITUSSIN) 100 MG/5ML syrup Take 100 mg by mouth 3 (three) times daily as needed for cough.   Yes Historical Provider, MD  ibuprofen (ADVIL,MOTRIN) 200 MG tablet Take 200 mg by mouth every 6 (six) hours as needed for moderate pain.   Yes Historical Provider, MD  Phenyleph-Doxylamine-DM-APAP (ALKA SELTZER PLUS PO) Take 1 each by mouth daily.   Yes Historical Provider, MD  erythromycin ophthalmic ointment Place 1 application into both eyes every 6 (six) hours. Place 1/2 inch ribbon of ointment in the affected eye 4 times a day for 5 days 12/21/15   Ambrose Finland Babak Lucus, MD   BP 121/84 mmHg  Pulse 86  Temp(Src) 99.1 F (37.3 C) (Oral)  Resp 21  SpO2 98% Physical Exam  Constitutional: She is oriented to person, place, and time. She appears well-developed and well-nourished. No distress.  HENT:  Head: Normocephalic and atraumatic.  Moist mucous membranes, mild erythema of posterior oropharynx with no tonsillar asymmetry  Eyes: Pupils are equal, round, and reactive to light.  Scleral and conjunctival injection with no drainage  Neck: Neck supple.  Cardiovascular: Normal rate, regular rhythm and normal  heart sounds.   No murmur heard. Pulmonary/Chest: Effort normal and breath sounds normal. She has no wheezes.  Abdominal: Soft. Bowel sounds are normal. She exhibits no distension. There is no tenderness.  Musculoskeletal: She exhibits no edema.  Neurological: She is alert and oriented to person, place, and time.  Fluent speech  Skin: Skin is warm and dry.  Psychiatric: She has a normal mood and affect. Judgment normal.  Nursing note and vitals reviewed.   ED Course  Procedures (including critical care time) Labs Review Labs Reviewed - No data to  display    MDM   Final diagnoses:  Bilateral conjunctivitis  Viral upper respiratory infection    Patient presents with several days of upper respiratory infection symptoms as well as redness of both eyes. She was well-appearing with normal vital signs at presentation. No wheezing or abnormal lung sounds and no reports of fevers or shortness of breath. Exam consistent with viral conjunctivitis, however provided erythromycin ointment for her eyes to allowed to return to work. Instructed on supportive care. Patient and mom in agreement with plan and patient discharged in satisfactory condition.  Laurence Spatesachel Morgan Larkin Morelos, MD 12/21/15 [phone]

## 2015-12-21 NOTE — ED Notes (Signed)
Patient here with complaints of cough, nasal congestion x4 days and bilateral eye redness. Given tessalon with no relief. Denies fever.

## 2015-12-29 ENCOUNTER — Encounter (HOSPITAL_COMMUNITY): Payer: Self-pay | Admitting: *Deleted

## 2015-12-29 ENCOUNTER — Emergency Department (HOSPITAL_COMMUNITY): Payer: Medicaid Other

## 2015-12-29 ENCOUNTER — Emergency Department (HOSPITAL_COMMUNITY)
Admission: EM | Admit: 2015-12-29 | Discharge: 2015-12-29 | Disposition: A | Discharge status: Home / Self Care | Payer: Medicaid Other | Attending: Emergency Medicine | Admitting: Emergency Medicine

## 2015-12-29 DIAGNOSIS — J45909 Unspecified asthma, uncomplicated: Secondary | ICD-10-CM | POA: Insufficient documentation

## 2015-12-29 DIAGNOSIS — M779 Enthesopathy, unspecified: Secondary | ICD-10-CM | POA: Insufficient documentation

## 2015-12-29 DIAGNOSIS — M778 Other enthesopathies, not elsewhere classified: Secondary | ICD-10-CM

## 2015-12-29 DIAGNOSIS — Z872 Personal history of diseases of the skin and subcutaneous tissue: Secondary | ICD-10-CM | POA: Insufficient documentation

## 2015-12-29 DIAGNOSIS — Z8659 Personal history of other mental and behavioral disorders: Secondary | ICD-10-CM | POA: Insufficient documentation

## 2015-12-29 DIAGNOSIS — Z79899 Other long term (current) drug therapy: Secondary | ICD-10-CM | POA: Insufficient documentation

## 2015-12-29 NOTE — ED Notes (Signed)
States she jammed her right 2nd finger 2 weeks ago , states it is still hurting

## 2015-12-29 NOTE — Discharge Instructions (Signed)
Tendinitis and Tenosynovitis  °Tendinitis is inflammation of the tendon. Tenosynovitis is inflammation of the lining around the tendon (tendon sheath). These painful conditions often occur at once. Tendons attach muscle to bone. To move a limb, force from the muscle moves through the tendon, to the bone. These conditions often cause increased pain when moving. Tendinitis may be caused by a small or partial tear in the tendon.  °SYMPTOMS  °· Pain, tenderness, redness, bruising, or swelling at the injury. °· Loss of normal joint movement. °· Pain that gets worse with use of the muscle and joint attached to the tendon. °· Weakness in the tendon, caused by calcium build up that may occur with tendinitis. °· Commonly affected tendons: °¨ Achilles tendon (calf of leg). °¨ Rotator cuff (shoulder joint). °¨ Patellar tendon (kneecap to shin). °¨ Peroneal tendon (ankle). °¨ Posterior tibial tendon (inner ankle). °¨ Biceps tendon (in front of shoulder). °CAUSES  °· Sudden strain on a flexed muscle, muscle overuse, sudden increase or change in activity, vigorous activity. °· Result of a direct hit (less common). °· Poor muscle action (biomechanics). °RISK INCREASES WITH: °· Injury (trauma). °· Too much exercise. °· Sudden change in athletic activity. °· Incorrect exercise form or technique. °· Poor strength and flexibility. °· Not warming-up properly before activity. °· Returning to activity before healing is complete. °PREVENTION  °· Warm-up and stretch properly before activity. °· Maintain physical fitness: °¨ Joint flexibility. °¨ Muscle strength and endurance. °¨ Fitness that increases heart rate. °· Learn and use proper exercise techniques. °· Use rehabilitation exercises to strengthen weak muscles and tendons. °· Ice the tendon after activity, to reduce recurring inflammation. °· Wear proper fitting protective equipment for specific tendons, when indicated. °PROGNOSIS  °When treated properly, can be cured in 6 to 8 weeks.  Recovery may take longer, depending on degree of injury.  °RELATED COMPLICATIONS  °· Re-injury or recurring symptoms. °· Permanent weakness or joint stiffness, if injury is severe and recovery is not completed. °· Delayed healing, if sports are started before healing is complete. °· Tearing apart (rupture) of the inflamed tendon. Tendinitis means the tendon is injured and must recover. °TREATMENT  °Treatment first involves ice, medicine, and rest from aggravating activities. This reduces pain and inflammation. Modifying your activity may be considered to prevent recurring injury. A brace, elastic bandage wrap, splint, cast, or sling may be prescribed to protect the joint for a short period. After that period, strengthening and stretching exercise may help to regain strength and full range of motion. If the condition persists, despite non-surgical treatment, surgery may be recommended to remove the inflamed tendon lining. Corticosteroid injections may be given to reduce inflammation. However, these injections may weaken the tendon and increase your risk for tendon rupture. °MEDICATION  °· If pain medicine is needed, nonsteroidal anti-inflammatory medicines (aspirin and ibuprofen), or other minor pain relievers (acetaminophen), are often recommended. °· Do not take pain medicine for 7 days before surgery. °· Prescription pain relievers are usually prescribed only after surgery. Use only as directed and only as much as you need. °· Ointments applied to the skin may be helpful. °· Corticosteroid injections may be given to reduce inflammation. However, this may increase your risk of a tendon rupture. °HEAT AND COLD °· Cold treatment (icing) relieves pain and reduces inflammation. Cold treatment should be applied for 10 to 15 minutes every 2 to 3 hours, and immediately after activity that aggravates your symptoms. Use ice packs or an ice massage. °· Heat   treatment may be used before performing stretching and strengthening  activities prescribed by your caregiver, physical therapist, or athletic trainer. Use a heat pack or a warm water soak. °SEEK MEDICAL CARE IF:  °· Symptoms get worse or do not improve, despite treatment. °· Pain becomes too much to tolerate. °· You develop numbness or tingling. °· Toes become cold, or toenails become blue, gray, or dark colored. °· New, unexplained symptoms develop. (Drugs used in treatment may produce side effects.) °  °This information is not intended to replace advice given to you by your health care provider. Make sure you discuss any questions you have with your health care provider. °  °Document Released: 12/10/2005 Document Revised: 03/03/2012 Document Reviewed: 03/24/2009 °Elsevier Interactive Patient Education ©2016 [employer] ° °

## 2015-12-29 NOTE — ED Provider Notes (Signed)
CSN: [id number]     Arrival date & time 12/29/15  1135 History  By signing my name below, I, [name], attest that this documentation has been prepared under the direction and in the presence of Roxy Horsemanobert Jemaine Prokop, PA-C Electronically Signed: Charline BillsEssence Howell, ED Scribe 12/29/2015 at 1:36 PM.   Chief Complaint  Patient presents with  . Hand Pain   The history is provided by the patient. No language interpreter was used.   HPI Comments: Regina PallSharleace Mckee is a 20 y.o. female who presents to the Emergency Department with a chief complaint of constant right middle pain onset 2 weeks ago. 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 No known injury. She reports constant pain that is exacerbated with bending. No treatments tried PTA. Pt denies numbness and wrist pain.  Past Medical History  Diagnosis Date  . Asthma   . History of ADHD   . Eczema    Past Surgical History  Procedure Laterality Date  . No past surgeries     Family History  Problem Relation Age of Onset  . Stroke Other   . Hypertension Other   . Cancer Other   . Diabetes Other   . Hypertension Father    Social History  Substance Use Topics  . Smoking status: Never Smoker   . Smokeless tobacco: None  . Alcohol Use: No   OB History    No data available     Review of Systems  Musculoskeletal: Positive for arthralgias.  Neurological: Negative for numbness.   Allergies  Other  Home Medications   Prior to Admission medications   Medication Sig Start Date End Date Taking? Authorizing Provider  benzonatate (TESSALON) 100 MG capsule Take 1 capsule (100 mg total) by mouth every 8 (eight) hours. 12/18/15   Hannah Muthersbaugh, PA-C  ibuprofen (ADVIL,MOTRIN) 200 MG tablet Take 200 mg by mouth every 6 (six) hours as needed for moderate pain.    Historical Provider, MD   BP 117/69 mmHg  Pulse 98  Temp(Src) 99.2 F (37.3 C) (Oral)  Resp 16  SpO2 97% Physical Exam Physical Exam   Constitutional: Pt appears well-developed and well-nourished. No distress.  HENT:  Head: Normocephalic and atraumatic.  Eyes: Conjunctivae are normal.  Neck: Normal range of motion.  Cardiovascular: Normal rate, regular rhythm and intact distal pulses.   Capillary refill < 3 sec  Pulmonary/Chest: Effort normal and breath sounds normal.  Musculoskeletal: Pt exhibits tenderness of the R middle finger at the MCP. Pt exhibits no edema. No bony abnormality or deformity.  ROM: 5/5 Neurological: Pt  is alert. Coordination normal.  Sensation 5/5 Strength 5/5 Skin: Skin is warm and dry. Pt is not diaphoretic. No erythema, discharge or abscess. Doubt tenosynovitis. No tenting of the skin  Psychiatric: Pt has a normal mood and affect.  Nursing note and vitals reviewed.  ED Course  Procedures (including critical care time) DIAGNOSTIC STUDIES: Oxygen Saturation is 97% on RA, normal by my interpretation.    COORDINATION OF CARE: 1:29 PM-Discussed treatment plan which includes XR and ibuprofen with pt at bedside and pt agreed to plan.   Imaging Review Dg Finger Middle Right  12/29/2015  CLINICAL DATA:  Pain for 2 weeks.  No known injury EXAM: RIGHT THIRD FINGER 2+V COMPARISON:  None. FINDINGS: Frontal, oblique, and lateral views were obtained. There is no demonstrable fracture or dislocation. Joint spaces appear intact. No erosive change. No soft tissue swelling or radiopaque foreign body. IMPRESSION: No fracture or dislocation.  No arthropathy. No radiopaque foreign body or soft tissue edema. Electronically Signed   By: Bretta Bang III M.D.   On: 12/29/2015 13:05   I have personally reviewed and evaluated these images and lab results as part of my medical decision-making.   MDM   Final diagnoses:  Tendonitis of finger    Patient with right middle finger pain. She works as Conservation officer, nature. Plain films are negative. Could be sprain versus tendinitis. Recommend hand follow-up in 2 weeks.  I  personally performed the services described in this documentation, which was scribed in my presence. The recorded information has been reviewed and is accurate.      Roxy Horseman, PA-C 12/29/15 1354  Lavera Guise, MD 12/30/15 [phone]

## 2015-12-29 NOTE — ED Notes (Signed)
Declined W/C at D/C and was escorted to lobby by RN. 

## 2017-06-11 ENCOUNTER — Encounter (HOSPITAL_COMMUNITY): Payer: Self-pay | Admitting: Nurse Practitioner

## 2017-06-11 ENCOUNTER — Emergency Department (HOSPITAL_COMMUNITY)
Admission: EM | Admit: 2017-06-11 | Discharge: 2017-06-11 | Disposition: A | Discharge status: Home / Self Care | Payer: Medicaid Other | Attending: Emergency Medicine | Admitting: Emergency Medicine

## 2017-06-11 DIAGNOSIS — R21 Rash and other nonspecific skin eruption: Secondary | ICD-10-CM

## 2017-06-11 DIAGNOSIS — B354 Tinea corporis: Secondary | ICD-10-CM

## 2017-06-11 DIAGNOSIS — J45909 Unspecified asthma, uncomplicated: Secondary | ICD-10-CM | POA: Insufficient documentation

## 2017-06-11 MED ORDER — KETOCONAZOLE 2 % EX SHAM: 1.0000 "application " | MEDICATED_SHAMPOO | Freq: Every day | CUTANEOUS | 0 refills | Status: AC

## 2017-06-11 NOTE — ED Triage Notes (Signed)
Pt presents with c/o rash. The rash began about 5 days ago. The rash is on her anterior/postrior trunk and right thigh. The rash is itchy. The rash is not painful. Shes tried cleaning with alcohol and applying gold bond with some relief of the itching.

## 2017-06-11 NOTE — ED Provider Notes (Signed)
[facility]-EMERGENCY DEPT Provider Note   CSN: [id number] Arrival date & time: 06/11/17  1624  By signing my name below, I, Phillips ClimesFabiola de Louis, attest that this documentation has been prepared under the direction and in the presence of Sharen Hecklaudia Jaiyana Canale, PA-C. Electronically Signed: Phillips ClimesFabiola de Louis, Scribe. 06/11/2017. 5:53 PM.  History   Chief Complaint Chief Complaint  Patient presents with  . Rash   HPI Comments Regina Mckee is a 21 y.o. female with a PMHx significant for asthma and eczema, who presents to the Emergency Department with complaints of a sudden onset generalized, itchy rash x5 days.  No fevers or chills.  Patient had one day of nausea, NBNB vomiting and diarrhea several days ago, which resolved.  No recent animal or tick exposures.  No new soaps, lotions, detergents, foods, animals, plants, or medications.  No family members at home with similar sx.  Pt has experienced no relief with the use of alcohol and gold bond.  Pt denies experiencing any other acute sx. Patient works at a garage outside, she is constantly sweating at work.   The history is provided by the patient.   Past Medical History:  Diagnosis Date  . Asthma   . Eczema   . History of ADHD     There are no active problems to display for this patient.   Past Surgical History:  Procedure Laterality Date  . NO PAST SURGERIES      OB History    No data available       Home Medications    Prior to Admission medications   Medication Sig Start Date End Date Taking? Authorizing Provider  benzonatate (TESSALON) 100 MG capsule Take 1 capsule (100 mg total) by mouth every 8 (eight) hours. 12/18/15   Muthersbaugh, Dahlia ClientHannah, PA-C  ibuprofen (ADVIL,MOTRIN) 200 MG tablet Take 200 mg by mouth every 6 (six) hours as needed for moderate pain.    [provider]  ketoconazole (NIZORAL) 2 % shampoo Apply 1 application topically daily. 06/11/17 06/21/17  Liberty HandyGibbons, Dayleen Beske J, PA-C    Family  History Family History  Problem Relation Age of Onset  . Stroke Other   . Hypertension Other   . Cancer Other   . Diabetes Other   . Hypertension Father     Social History Social History  Substance Use Topics  . Smoking status: Never Smoker  . Smokeless tobacco: Never Used  . Alcohol use Yes     Allergies   Other  Review of Systems Review of Systems  Constitutional: Negative for chills and fever.  Gastrointestinal: Positive for diarrhea (Resolved), nausea (Resolved) and vomiting (Resolved).  Skin: Positive for rash.   Physical Exam Updated Vital Signs BP 107/84   Pulse 77   Temp 98.5 F (36.9 C) (Oral)   Resp 15   SpO2 98%   Physical Exam  Constitutional: She is oriented to person, place, and time. She appears well-developed and well-nourished. No distress.  NAD.  HENT:  Head: Normocephalic and atraumatic.  Right Ear: External ear normal.  Left Ear: External ear normal.  Nose: Nose normal.  No oropharynx, tongue or intra oral lesions  Eyes: Conjunctivae and EOM are normal. No scleral icterus.  Neck: Normal range of motion. Neck supple.  No cervical adenopathy  Cardiovascular: Normal rate, regular rhythm and normal heart sounds.   No murmur heard. Pulmonary/Chest: Effort normal and breath sounds normal. She has no wheezes.  Musculoskeletal: Normal range of motion. She exhibits no deformity.  Neurological: She is  alert and oriented to person, place, and time.  Skin: Skin is warm and dry. Capillary refill takes less than 2 seconds. Rash noted.  Central chest, inner thighs and abdomen with pruritic, circular or oval, erythematous, scaling patch or plaque that spreads centrifugally   Psychiatric: She has a normal mood and affect. Her behavior is normal. Judgment and thought content normal.  Nursing note and vitals reviewed.  ED Treatments / Results  DIAGNOSTIC STUDIES: Oxygen Saturation is 98% on room air, normal by my interpretation.    COORDINATION OF  CARE: 5:32 PM Discussed treatment plan with pt at bedside and pt agreed to plan.  Labs (all labs ordered are listed, but only abnormal results are displayed) Labs Reviewed - No data to display  EKG  EKG Interpretation None       Radiology No results found.  Procedures Procedures (including critical care time)  Medications Ordered in ED Medications - No data to display   Initial Impression / Assessment and Plan / ED Course  I have reviewed the triage vital signs and the nursing notes.  Pertinent labs & imaging results that were available during my care of the patient were reviewed by me and considered in my medical decision making (see chart for details).      21 y.o. yo female with pertinent pmh of eczema presents with erythematous, pruritic, oval rash that spread centrifugally x 5 days.  Patient works at [employer] and sweats a lot while at work.  No streaking or linear pattern.  No facial or oral angioedema.  No respiratory compromise.  No recent exposure to possible allergen.  No new topical hygiene products used.  No new medications.  No evidence of animal bite wounds or tick exposure.  No fluctuance, induration that would suggest abscess or cellulitis. Very low suspicion for dermatological emergency including EM, SJS, TEN or meningococcemia.  Patient otherwise feels well and at baseline, no URI or viral type systemic symptoms that would suggest viral exanthem.  Suspect tinea corporis given physical exam appearance. Patient will be discharged with antihistamines, antifungal shampoo and close f/u with PCP.  Strict ED return precautions given, mother is aware of red flag symptoms that would warrant return to ED for further evaluation and tx.     Final Clinical Impressions(s) / ED Diagnoses   Final diagnoses:  Rash  Tinea corporis   I personally performed the services described in this documentation, which was scribed in my presence. The recorded information has  been reviewed and is accurate.   New Prescriptions New Prescriptions   KETOCONAZOLE (NIZORAL) 2 % SHAMPOO    Apply 1 application topically daily.     Liberty Handy, PA-C 06/11/17 1753    Mancel Bale, MD 06/11/17 [phone]

## 2017-06-11 NOTE — Discharge Instructions (Signed)
I suspect your rash is from a fungus. These types of fungal  infections are called tinea. Some people call these fungal infections ringworm because of their "ring" shape. They do not come from worms.   We will treat the rash with an antifungal shampoo. Please apply shampoo to areas of rash daily before showers. Apply thin layer and wait 20 minutes before rinsing off. Use to shampoo for the next 10 days, the rash may still be there however the infection has been controlled. It is common for these types of fungal infections to reoccur. The best way to prevent them is to avoid over sweating or changing her clothes frequently and rinsing off after activities.  Return to the emergency department if your rash progresses or expand significantly or you develop fever, body aches, joint pain, nausea, vomiting, abdominal pain.

## 2017-06-11 NOTE — ED Notes (Addendum)
Pt complaining of rash on abdomen, back, and right outer thigh.  Denies Fever, chills, or pain.  States that rash started being noticed on Saturday.  She states that she also had nausea, vomiting, and diarrhea all Saturday as well.  Denies any new meds, vitamins, soaps, or detergent.  Only known allergy is to pollen and cherries.

## 2018-04-15 ENCOUNTER — Encounter (HOSPITAL_COMMUNITY): Payer: Self-pay | Admitting: *Deleted

## 2018-04-15 ENCOUNTER — Emergency Department (HOSPITAL_COMMUNITY): Payer: Self-pay

## 2018-04-15 ENCOUNTER — Emergency Department (HOSPITAL_COMMUNITY)
Admission: EM | Admit: 2018-04-15 | Discharge: 2018-04-15 | Disposition: A | Discharge status: Home / Self Care | Payer: Self-pay | Attending: Emergency Medicine | Admitting: Emergency Medicine

## 2018-04-15 ENCOUNTER — Other Ambulatory Visit: Payer: Self-pay

## 2018-04-15 DIAGNOSIS — R059 Cough, unspecified: Secondary | ICD-10-CM

## 2018-04-15 DIAGNOSIS — R05 Cough: Secondary | ICD-10-CM | POA: Insufficient documentation

## 2018-04-15 DIAGNOSIS — Z79899 Other long term (current) drug therapy: Secondary | ICD-10-CM | POA: Insufficient documentation

## 2018-04-15 DIAGNOSIS — J302 Other seasonal allergic rhinitis: Secondary | ICD-10-CM | POA: Insufficient documentation

## 2018-04-15 MED ORDER — BENZONATATE 100 MG PO CAPS: 100.0000 mg | ORAL_CAPSULE | Freq: Three times a day (TID) | ORAL | 0 refills | Status: DC

## 2018-04-15 NOTE — ED Notes (Signed)
Pt returned from radiology at this time.  

## 2018-04-15 NOTE — ED Triage Notes (Signed)
Pt in c/o cough for the last month, had a fever at one point but that has resolved, cough is productive with yellow sputum, hx of asthma but denies increased inhaler use, no distress noted

## 2018-04-15 NOTE — Discharge Instructions (Addendum)
Continue treating her allergies with daytime allergy medicine. Use saline nasal spray for congestion and blow your nose frequently to avoid postnasal drip. Use your inhaler as needed for wheezing or persistent cough. You can take Tessalon every 8 hours as needed for cough. Follow up with your primary care provider symptoms persist.

## 2018-04-15 NOTE — ED Provider Notes (Signed)
[facility] EMERGENCY DEPARTMENT Provider Note   CSN: [id number] Arrival date & time: 04/15/18  4098     History   Chief Complaint Chief Complaint  Patient presents with  . Cough    HPI Regina Mckee is a 22 y.o. female with past medical history of asthma, seasonal allergies, presenting to the ED with 2 weeks of productive cough.  Patient states she had flulike symptoms the setting of fever with nausea vomiting and diarrhea, about a month ago which resolved.  She states about 2 weeks ago she began having a cough that has been persistent and productive of yellow sputum.  She reports associated symptoms of seasonal allergies, including nasal congestion with postnasal drip.  She denies fever or chills, sore throat, ear pain, wheezing, shortness of breath.  Has not felt she needed to use her albuterol inhaler.  The history is provided by the patient.    Past Medical History:  Diagnosis Date  . Asthma   . Eczema   . History of ADHD     There are no active problems to display for this patient.   Past Surgical History:  Procedure Laterality Date  . NO PAST SURGERIES       OB History   None      Home Medications    Prior to Admission medications   Medication Sig Start Date End Date Taking? Authorizing Provider  benzonatate (TESSALON) 100 MG capsule Take 1 capsule (100 mg total) by mouth every 8 (eight) hours. 04/15/18   Robinson, [place] N, PA-C  ibuprofen (ADVIL,MOTRIN) 200 MG tablet Take 200 mg by mouth every 6 (six) hours as needed for moderate pain.    [provider]    Family History Family History  Problem Relation Age of Onset  . Stroke Other   . Hypertension Other   . Cancer Other   . Diabetes Other   . Hypertension Father     Social History Social History   Tobacco Use  . Smoking status: Never Smoker  . Smokeless tobacco: Never Used  Substance Use Topics  . Alcohol use: Yes  . Drug use: No     Allergies     Other   Review of Systems Review of Systems  Constitutional: Negative for fever.  HENT: Positive for congestion, postnasal drip and sneezing. Negative for ear pain and sore throat.   Respiratory: Positive for cough. Negative for shortness of breath and wheezing.      Physical Exam Updated Vital Signs BP 112/83 (BP Location: Right Arm)   Pulse (!) 55   Temp 98.5 F (36.9 C) (Oral)   Resp 20   SpO2 100%   Physical Exam  Constitutional: She appears well-developed and well-nourished. No distress.  Well-appearing, tolerating secretions.  No increased work of breathing.  HENT:  Head: Normocephalic and atraumatic.  Right Ear: Tympanic membrane, external ear and ear canal normal.  Left Ear: Tympanic membrane, external ear and ear canal normal.  Nose: Mucosal edema present.  Mouth/Throat: Uvula is midline and oropharynx is clear and moist. No trismus in the jaw. No uvula swelling.  Eyes: Conjunctivae are normal.  Neck: Normal range of motion. Neck supple.  Pulmonary/Chest: Effort normal and breath sounds normal. No stridor. No respiratory distress. She has no wheezes. She has no rales.  Lymphadenopathy:    She has no cervical adenopathy.  Psychiatric: She has a normal mood and affect. Her behavior is normal.  Nursing note and vitals reviewed.    ED  Treatments / Results  Labs (all labs ordered are listed, but only abnormal results are displayed) Labs Reviewed - No data to display  EKG None  Radiology Dg Chest 2 View  Result Date: 04/15/2018 CLINICAL DATA:  Cough, chest congestion, and fever for 1 month. Asthma. EXAM: CHEST - 2 VIEW COMPARISON:  02/17/2015 FINDINGS: The heart size and mediastinal contours are within normal limits. Both lungs are clear. The visualized skeletal structures are unremarkable. IMPRESSION: Negative.  No active cardiopulmonary disease. Electronically Signed   By: Myles RosenthalJohn  Stahl M.D.   On: 04/15/2018 12:02    Procedures Procedures (including critical  care time)  Medications Ordered in ED Medications - No data to display   Initial Impression / Assessment and Plan / ED Course  I have reviewed the triage vital signs and the nursing notes.  Pertinent labs & imaging results that were available during my care of the patient were reviewed by me and considered in my medical decision making (see chart for details).     Pt presenting to the ED with cough and seasonal allergies.  No fevers or other signs of upper respiratory infection.  Lungs are clear bilaterally, ENT exam is benign.  Chest x-ray is negative for pneumonia.  Patient is not having asthma exacerbation.  Discussed symptomatic management for allergies and cough.  Will prescribe Tessalon.  Safe for discharge.  Discussed results, findings, treatment and follow up. Patient advised of return precautions. Patient verbalized understanding and agreed with plan.  Final Clinical Impressions(s) / ED Diagnoses   Final diagnoses:  Seasonal allergies  Cough    ED Discharge Orders        Ordered    benzonatate (TESSALON) 100 MG capsule  Every 8 hours     04/15/18 1217       Robinson, [place] N, PA-C 04/15/18 1218    Phillis HaggisMabe, Martha L, MD 04/15/18 1327

## 2018-09-19 ENCOUNTER — Telehealth (HOSPITAL_COMMUNITY): Payer: Self-pay | Admitting: *Deleted

## 2018-09-19 NOTE — Telephone Encounter (Signed)
Telephoned patient at mobile and left message to return call to [facility]. Telephoned patient at home number was busy.

## 2018-11-21 ENCOUNTER — Other Ambulatory Visit: Payer: Self-pay

## 2018-11-21 ENCOUNTER — Emergency Department (HOSPITAL_COMMUNITY)
Admission: EM | Admit: 2018-11-21 | Discharge: 2018-11-21 | Disposition: A | Discharge status: Home / Self Care | Payer: Medicaid Other | Attending: Emergency Medicine | Admitting: Emergency Medicine

## 2018-11-21 DIAGNOSIS — R059 Cough, unspecified: Secondary | ICD-10-CM

## 2018-11-21 DIAGNOSIS — R05 Cough: Secondary | ICD-10-CM | POA: Insufficient documentation

## 2018-11-21 DIAGNOSIS — J45901 Unspecified asthma with (acute) exacerbation: Secondary | ICD-10-CM | POA: Insufficient documentation

## 2018-11-21 MED ORDER — PREDNISONE 20 MG PO TABS: 40.0000 mg | ORAL_TABLET | Freq: Every day | ORAL | 0 refills | Status: DC

## 2018-11-21 MED ORDER — BENZONATATE 100 MG PO CAPS: 100.0000 mg | ORAL_CAPSULE | Freq: Three times a day (TID) | ORAL | 0 refills | Status: DC

## 2018-11-21 NOTE — Discharge Instructions (Signed)
Please read and follow all provided instructions.  Your diagnoses today include:  1. Cough     Tests performed today include:  Vital signs. See below for your results today.   Medications prescribed:   Prednisone - steroid medicine   It is best to take this medication in the morning to prevent sleeping problems. If you are diabetic, monitor your blood sugar closely and stop taking Prednisone if blood sugar is over 300. Take with food to prevent stomach upset.    Tessalon Perles - cough suppressant medication  Take any prescribed medications only as directed.  Home care instructions:  Follow any educational materials contained in this packet.  Follow-up instructions: Please follow-up with your primary care provider in the next 7 days for further evaluation of your symptoms and a recheck if you are not feeling better.   Return instructions:   Please return to the Emergency Department if you experience worsening symptoms.  Please return with worsening wheezing, shortness of breath, or difficulty breathing.  Return with persistent fever above 101F.   Please return if you have any other emergent concerns.  Additional Information:  Your vital signs today were: BP (!) 111/95    Pulse 99    Temp 98.1 F (36.7 C) (Oral)    Resp 18    Ht 5\' 3"  (1.6 m)    SpO2 99%    BMI 22.85 kg/m  If your blood pressure (BP) was elevated above 135/85 this visit, please have this repeated by your doctor within one month. --------------

## 2018-11-21 NOTE — ED Notes (Signed)
ED Provider at bedside. 

## 2018-11-21 NOTE — ED Triage Notes (Signed)
Pt endorses cough x2 months. Pt denies any mucous production. Pt reports some throat pain.

## 2018-11-21 NOTE — ED Provider Notes (Signed)
[facility] EMERGENCY [facility]PARTMENT Provider Note   CSN: [id number] Arrival date & time: 11/21/18  2016     History   Chief Complaint Chief Complaint  Patient presents with  . Cough    HPI Regina Mckee is a 22 y.o. female.  Patient with history of mild asthma presents emergency department with ongoing cough for the proximally the past 2 months.  She states that she has tried everything over-the-counter without any improvement.  She reports increased wheezing and has been using her inhaler more frequently.  No fever or chills.  She denies any ear pain, runny nose, sore throat.  No chest pain or shortness of breath.  Sometimes she has coughing spells which cause her to throw up.  No lower extremity swelling.  No known sick contacts.  Onset of symptoms acute.  Course is constant.  She does report more significant URI symptoms at onset which resolved.     Past Medical History:  Diagnosis Date  . Asthma   . Eczema   . History of ADHD     There are no active problems to display for this patient.   Past Surgical History:  Procedure Laterality Date  . NO PAST SURGERIES       OB History   None      Home Medications    Prior to Admission medications   Medication Sig Start Date End Date Taking? Authorizing Provider  benzonatate (TESSALON) 100 MG capsule Take 1 capsule (100 mg total) by mouth every 8 (eight) hours. 11/21/18   Renne CriglerGeiple, Elaynah [place], PA-C  ibuprofen (ADVIL,MOTRIN) 200 MG tablet Take 200 mg by mouth every 6 (six) hours as needed for moderate pain.    [provider]  predniSONE (DELTASONE) 20 MG tablet Take 2 tablets (40 mg total) by mouth daily. 11/21/18   Renne CriglerGeiple, Alejandra Barna, PA-C    Family History Family History  Problem Relation Age of Onset  . Stroke Other   . Hypertension Other   . Cancer Other   . Diabetes Other   . Hypertension Father     Social History Social History   Tobacco Use  . Smoking status: Never Smoker   . Smokeless tobacco: Never Used  Substance Use Topics  . Alcohol use: Yes  . Drug use: No     Allergies   Other   Review of Systems Review of Systems  Constitutional: Negative for fever.  HENT: Negative for rhinorrhea and sore throat.   Eyes: Negative for redness.  Respiratory: Positive for cough and wheezing.   Cardiovascular: Negative for chest pain.  Gastrointestinal: Negative for abdominal pain, diarrhea, nausea and vomiting.  Genitourinary: Negative for dysuria.  Musculoskeletal: Negative for myalgias.  Skin: Negative for rash.  Neurological: Negative for headaches.     Physical Exam Updated Vital Signs BP (!) 111/95   Pulse 99   Temp 98.1 F (36.7 C) (Oral)   Resp 18   Ht 5\' 3"  (1.6 m)   SpO2 99%   BMI 22.85 kg/m   Physical Exam  Constitutional: She appears well-developed and well-nourished.  HENT:  Head: Normocephalic and atraumatic.  Right Ear: Tympanic membrane, external ear and ear canal normal.  Left Ear: Tympanic membrane, external ear and ear canal normal.  Nose: Nose normal. No mucosal edema or rhinorrhea.  Mouth/Throat: Uvula is midline, oropharynx is clear and moist and mucous membranes are normal. Mucous membranes are not dry. No oral lesions. No trismus in the jaw. No uvula swelling. No oropharyngeal exudate, posterior  oropharyngeal edema, posterior oropharyngeal erythema or tonsillar abscesses.  Eyes: Conjunctivae are normal. Right eye exhibits no discharge. Left eye exhibits no discharge.  Neck: Normal range of motion. Neck supple.  Cardiovascular: Normal rate, regular rhythm and normal heart sounds.  Pulmonary/Chest: Effort normal. No respiratory distress. She has wheezes. She has no rales.  Slight scattered end expiratory wheezing.  Occasional cough during exam.  Abdominal: Soft. There is no tenderness. There is no rebound and no guarding.  Musculoskeletal: She exhibits no edema.  Lymphadenopathy:    She has no cervical adenopathy.    Neurological: She is alert.  Skin: Skin is warm and dry.  Psychiatric: She has a normal mood and affect.  Nursing note and vitals reviewed.    ED Treatments / Results  Labs (all labs ordered are listed, but only abnormal results are displayed) Labs Reviewed - No data to display  EKG None  Radiology No results found.  Procedures Procedures (including critical care time)  Medications Ordered in ED Medications - No data to display   Initial Impression / Assessment and Plan / ED Course  I have reviewed the triage vital signs and the nursing notes.  Pertinent labs & imaging results that were available during my care of the patient were reviewed by me and considered in my medical decision making (see chart for details).     Patient seen and examined.  Patient with chronic cough after URI illness.  Likely residual bronchitis.  No fevers or crackles to make me think that she has pneumonia here today.  She does have underlying asthma.  Will treat with prednisone and Tessalon.  Patient will continue to use her home albuterol inhaler.  I encouraged her to follow-up with her doctor in 1 week for consideration of chest x-ray if her symptoms are still not improved.   Vital signs reviewed and are as follows: BP (!) 111/95   Pulse 99   Temp 98.1 F (36.7 C) (Oral)   Resp 18   Ht 5\' 3"  (1.6 m)   SpO2 99%   BMI 22.85 kg/m     Final Clinical Impressions(s) / ED Diagnoses   Final diagnoses:  Cough   Patient with likely bronchitis. Low concern for PNA given lung exam and history.  Most likely etiology of chronic cough is bronchospasm.  We will treat this and have patient follow-up with PCP if not improved.  Antibiotics not indicated at this time. Conservative therapy indicated.     ED Discharge Orders         Ordered    benzonatate (TESSALON) 100 MG capsule  Every 8 hours     11/21/18 2032    predniSONE (DELTASONE) 20 MG tablet  Daily     11/21/18 2032           Renne Crigler, PA-C 11/21/18 2037    Rolan Bucco, MD 11/21/18 2038

## 2020-07-24 ENCOUNTER — Encounter (HOSPITAL_COMMUNITY): Payer: Self-pay

## 2020-07-24 ENCOUNTER — Other Ambulatory Visit: Payer: Self-pay

## 2020-07-24 ENCOUNTER — Emergency Department (HOSPITAL_COMMUNITY)
Admission: EM | Admit: 2020-07-24 | Discharge: 2020-07-24 | Disposition: A | Discharge status: Home / Self Care | Payer: Medicaid Other | Attending: Emergency Medicine | Admitting: Emergency Medicine

## 2020-07-24 DIAGNOSIS — J45909 Unspecified asthma, uncomplicated: Secondary | ICD-10-CM | POA: Insufficient documentation

## 2020-07-24 DIAGNOSIS — R112 Nausea with vomiting, unspecified: Secondary | ICD-10-CM | POA: Insufficient documentation

## 2020-07-24 DIAGNOSIS — Z20822 Contact with and (suspected) exposure to covid-19: Secondary | ICD-10-CM | POA: Insufficient documentation

## 2020-07-24 LAB — CBC
HCT: 41.3 % (ref 36.0–46.0)
Hemoglobin: 13.1 g/dL (ref 12.0–15.0)
MCH: 27.8 pg (ref 26.0–34.0)
MCHC: 31.7 g/dL (ref 30.0–36.0)
MCV: 87.7 fL (ref 80.0–100.0)
Platelets: 329 10*3/uL (ref 150–400)
RBC: 4.71 MIL/uL (ref 3.87–5.11)
RDW: 13.4 % (ref 11.5–15.5)
WBC: 8.3 10*3/uL (ref 4.0–10.5)
nRBC: 0 % (ref 0.0–0.2)

## 2020-07-24 LAB — I-STAT BETA HCG BLOOD, ED (MC, WL, AP ONLY): I-stat hCG, quantitative: <5 m[IU]/mL (ref <5)

## 2020-07-24 LAB — URINALYSIS, ROUTINE W REFLEX MICROSCOPIC
Bilirubin Urine: NEGATIVE
Glucose, UA: NEGATIVE mg/dL
Hgb urine dipstick: NEGATIVE
Ketones, ur: NEGATIVE mg/dL
Leukocytes,Ua: NEGATIVE
Nitrite: NEGATIVE
Protein, ur: NEGATIVE mg/dL
Specific Gravity, Urine: 1.012 (ref 1.005–1.030)
pH: 5 (ref 5.0–8.0)

## 2020-07-24 LAB — COMPREHENSIVE METABOLIC PANEL
ALT: 15 U/L (ref 0–44)
AST: 14 U/L — ABNORMAL LOW (ref 15–41)
Albumin: 3.7 g/dL (ref 3.5–5.0)
Alkaline Phosphatase: 87 U/L (ref 38–126)
Anion gap: 10 (ref 5–15)
BUN: 6 mg/dL (ref 6–20)
CO2: 22 mmol/L (ref 22–32)
Calcium: 9.2 mg/dL (ref 8.9–10.3)
Chloride: 108 mmol/L (ref 98–111)
Creatinine, Ser: 0.73 mg/dL (ref 0.44–1.00)
GFR calc Af Amer: >60 mL/min (ref >60)
GFR calc non Af Amer: >60 mL/min (ref >60)
Glucose, Bld: 98 mg/dL (ref 70–99)
Potassium: 3.9 mmol/L (ref 3.5–5.1)
Sodium: 140 mmol/L (ref 135–145)
Total Bilirubin: 0.5 mg/dL (ref 0.3–1.2)
Total Protein: 6.7 g/dL (ref 6.5–8.1)

## 2020-07-24 LAB — LIPASE, BLOOD: Lipase: 25 U/L (ref 11–51)

## 2020-07-24 LAB — SARS CORONAVIRUS 2 BY RT PCR (HOSPITAL ORDER, PERFORMED IN ~~LOC~~ HOSPITAL LAB): SARS Coronavirus 2: NEGATIVE

## 2020-07-24 MED ORDER — SODIUM CHLORIDE 0.9% FLUSH: 3.0000 mL | Freq: Once | INTRAVENOUS | Status: DC

## 2020-07-24 MED ORDER — ONDANSETRON 4 MG PO TBDP
4.0000 mg | ORAL_TABLET | Freq: Three times a day (TID) | ORAL | 0 refills | Status: DC | PRN
Start: 2020-07-24 — End: 2021-02-10

## 2020-07-24 NOTE — ED Notes (Signed)
Reviewed discharge instructions with patient including need to quarantine while waiting for COVID test results. Patient verbalized understanding, no further questions for care team at this time.

## 2020-07-24 NOTE — ED Triage Notes (Signed)
Pt reports BUE feeling "sleepy" and throwing up x3 days. Pt HTN in triage, no hx, pt concerned for high BP

## 2020-07-24 NOTE — ED Provider Notes (Signed)
[facility] EMERGENCY DEPARTMENT Provider Note   CSN: [id number] Arrival date & time: 07/24/20  0350     History Chief Complaint  Patient presents with  . Emesis  . Numbness    Regina Mckee is a 24 y.o. female.  HPI      Regina Mckee is a 24 y.o. female, with a history of asthma, presenting to the ED with nausea and vomiting for the last 3 days. No vomiting since ED arrival. Also notes tingling in both hands beginning simultaneously.  Denies fever/chills, chest pain, cough, shortness of breath, abdominal pain, diarrhea, hematochezia/melena, urinary symptoms, flank/back pain, or any other complaints.   Past Medical History:  Diagnosis Date  . Asthma   . Eczema   . History of ADHD     There are no problems to display for this patient.   Past Surgical History:  Procedure Laterality Date  . NO PAST SURGERIES       OB History   No obstetric history on file.     Family History  Problem Relation Age of Onset  . Stroke Other   . Hypertension Other   . Cancer Other   . Diabetes Other   . Hypertension Father     Social History   Tobacco Use  . Smoking status: Never Smoker  . Smokeless tobacco: Never Used  Substance Use Topics  . Alcohol use: Yes  . Drug use: No    Home Medications Prior to Admission medications   Medication Sig Start Date End Date Taking? Authorizing Provider  benzonatate (TESSALON) 100 MG capsule Take 1 capsule (100 mg total) by mouth every 8 (eight) hours. 11/21/18   Renne Crigler, PA-C  ibuprofen (ADVIL,MOTRIN) 200 MG tablet Take 200 mg by mouth every 6 (six) hours as needed for moderate pain.    [provider]  ondansetron (ZOFRAN ODT) 4 MG disintegrating tablet Take 1 tablet (4 mg total) by mouth every 8 (eight) hours as needed for nausea or vomiting. 07/24/20   Zaliyah Meikle C, PA-C  predniSONE (DELTASONE) 20 MG tablet Take 2 tablets (40 mg total) by mouth daily. 11/21/18    Renne Crigler, PA-C    Allergies    Other  Review of Systems   Review of Systems  Constitutional: Negative for chills, diaphoresis and fever.  Respiratory: Negative for cough and shortness of breath.   Cardiovascular: Negative for chest pain.  Gastrointestinal: Positive for nausea and vomiting. Negative for abdominal pain, blood in stool and diarrhea.  Neurological: Negative for dizziness, syncope, weakness and numbness.  All other systems reviewed and are negative.   Physical Exam Updated Vital Signs BP (!) 120/64   Pulse 47   Temp 98.7 F (37.1 C) (Oral)   Resp 16   Ht 5\' 3"  (1.6 m)   Wt 85.3 kg   SpO2 100%   BMI 33.30 kg/m   Physical Exam Vitals and nursing note reviewed.  Constitutional:      General: She is not in acute distress.    Appearance: She is well-developed. She is not diaphoretic.  HENT:     Head: Normocephalic and atraumatic.     Mouth/Throat:     Mouth: Mucous membranes are moist.     Pharynx: Oropharynx is clear.  Eyes:     Conjunctiva/sclera: Conjunctivae normal.  Cardiovascular:     Rate and Rhythm: Normal rate and regular rhythm.     Pulses: Normal pulses.  Radial pulses are 2+ on the right side and 2+ on the left side.     Heart sounds: Normal heart sounds.     Comments: Tactile temperature in the extremities appropriate and equal bilaterally. Pulmonary:     Effort: Pulmonary effort is normal. No respiratory distress.     Breath sounds: Normal breath sounds.  Abdominal:     Palpations: Abdomen is soft.     Tenderness: There is no abdominal tenderness. There is no guarding.  Musculoskeletal:     Cervical back: Neck supple.     Right lower leg: No edema.     Left lower leg: No edema.  Lymphadenopathy:     Cervical: No cervical adenopathy.  Skin:    General: Skin is warm and dry.  Neurological:     Mental Status: She is alert and oriented to person, place, and time.     Comments: No noted acute cognitive deficit. Sensation  grossly intact to light touch in the extremities.   Grip strengths equal bilaterally.   Strength 5/5 in all extremities.  No gait disturbance.  Coordination intact.  Cranial nerves III-XII grossly intact.  Handles oral secretions without noted difficulty.  No noted phonation or speech deficit. No facial droop.   Psychiatric:        Mood and Affect: Mood and affect normal.        Speech: Speech normal.        Behavior: Behavior normal.     ED Results / Procedures / Treatments   Labs (all labs ordered are listed, but only abnormal results are displayed) Labs Reviewed  COMPREHENSIVE METABOLIC PANEL - Abnormal; Notable for the following components:      Result Value   AST 14 (*)    All other components within normal limits  URINALYSIS, ROUTINE W REFLEX MICROSCOPIC - Abnormal; Notable for the following components:   APPearance HAZY (*)    All other components within normal limits  SARS CORONAVIRUS 2 BY RT PCR (HOSPITAL ORDER, PERFORMED IN [facility])  LIPASE, BLOOD  CBC  I-STAT BETA HCG BLOOD, ED ([facility], [facility], [facility] ONLY)    EKG EKG Interpretation  Date/Time:  Sunday July 24 2020 09:36:59 EDT Ventricular Rate:  63 PR Interval:  162 QRS Duration: 70 QT Interval:  402 QTC Calculation: 411 R Axis:   83 Text Interpretation: Sinus rhythm with marked sinus arrhythmia Otherwise normal ECG Confirmed by Cherlynn Perches ([id number]) on 07/24/2020 5:08:12 PM   Radiology No results found.  Procedures Procedures (including critical care time)  Medications Ordered in ED Medications  sodium chloride flush (NS) 0.9 % injection 3 mL (3 mLs Intravenous Not Given 07/24/20 1330)    ED Course  I have reviewed the triage vital signs and the nursing notes.  Pertinent labs & imaging results that were available during my care of the patient were reviewed by me and considered in my medical decision making (see chart for details).    MDM Rules/Calculators/[name]                           Patient presents with complaint of nausea and vomiting. Patient is nontoxic appearing, afebrile, not tachycardic, not tachypneic, not hypotensive, maintains excellent SPO2 on room air, and is in no apparent distress.   I have reviewed the patient's chart to obtain more information.   I reviewed and interpreted the patient's labs. Patient has not had any vomiting during her ED course.  Tolerating PO.  Abdominal exam benign. No focal neurologic deficits.  The patient was given instructions for home care as well as return precautions. Patient voices understanding of these instructions, accepts the plan, and is comfortable with discharge.  Esabella T Lamontagne was evaluated in Emergency Department on 07/24/2020 for the symptoms described in the history of present illness. She was evaluated in the context of the global COVID-19 pandemic, which necessitated consideration that the patient might be at risk for infection with the SARS-CoV-2 virus that causes COVID-19. Institutional protocols and algorithms that pertain to the evaluation of patients at risk for COVID-19 are in a state of rapid change based on information released by regulatory bodies including the CDC and federal and state organizations. These policies and algorithms were followed during the patient's care in the ED.  Vitals:   07/24/20 1330 07/24/20 1331 07/24/20 1430 07/24/20 1447  BP: (!) 134/96  (!) 120/64   Pulse: 81  47   Resp:   16   Temp:    98.7 F (37.1 C)  TempSrc:    Oral  SpO2: 98% 100% 100%   Weight:      Height:         Final Clinical Impression(s) / ED Diagnoses Final diagnoses:  Non-intractable vomiting with nausea, unspecified vomiting type    Rx / DC Orders ED Discharge Orders         Ordered    ondansetron (ZOFRAN ODT) 4 MG disintegrating tablet  Every 8 hours PRN     Discontinue  Reprint     07/24/20 1421           Anselm Pancoast, PA-C 07/24/20 1727    Sabino Donovan, MD 07/24/20 2111

## 2020-07-24 NOTE — Discharge Instructions (Addendum)
Nausea and Vomiting  Hand washing: Wash your hands throughout the day, but especially before and after touching the face, using the restroom, sneezing, coughing, or touching surfaces that have been coughed or sneezed upon. Hydration: Symptoms will be intensified and complicated by dehydration. Dehydration can also extend the duration of symptoms. Drink plenty of fluids and get plenty of rest. You should be drinking at least half a liter of water an hour to stay hydrated. Electrolyte drinks (ex. Gatorade, Powerade, Pedialyte) are also encouraged. You should be drinking enough fluids to make your urine light yellow, almost clear. If this is not the case, you are not drinking enough water. Please note that some of the treatments indicated below will not be effective if you are not adequately hydrated. Diet: Please concentrate on hydration, however, you may introduce food slowly.  Start with a clear liquid diet, progressed to a full liquid diet, and then bland solids as you are able. Pain or fever: Ibuprofen, Naproxen, or Tylenol for pain or fever.  Nausea/vomiting: Use the ondansetron (generic for Zofran) for nausea or vomiting.  This medication may not prevent all vomiting or nausea, but can help facilitate better hydration. Things that can help with nausea/vomiting also include peppermint/menthol candies, vitamin B12, and ginger. Follow-up: Follow-up with a primary care provider on this matter. Return: Return should you develop a fever, bloody diarrhea, increased abdominal pain, uncontrolled vomiting, or any other major concerns.  For prescription assistance, may try using prescription discount sites or apps, such as goodrx.com  Test Results for COVID-19 pending  You have a test pending for COVID-19.  Results typically return within about 48 hours.  Be sure to check MyChart for updated results.  We recommend isolating yourself until results are received.  Patients who have symptoms consistent with  COVID-19 should self isolated for: At least 3 days (72 hours) have passed since recovery, defined as resolution of fever without the use of fever reducing medications and improvement in respiratory symptoms (e.g., cough, shortness of breath), and At least 7 days have passed since symptoms first appeared.  If you have no symptoms, but your test returns positive, recommend isolating for at least 10 days.

## 2020-12-24 NOTE — L&D Delivery Note (Addendum)
LABOR COURSE Patient presented for PPROM on 9/13. She arrived to L&D on 9/14 and cervical exam was 0/thick/-3. She was augmented with cytotec, a foley bulb, and pitocin. She progressed to complete around 0740 on 9/15.   Delivery Note Called to room and patient was complete and pushing. Head delivered LOA. No nuchal cord present. Shoulder and body delivered in usual fashion and easily. At  44 a healthy female was delivered via Vaginal, Spontaneous vertex presentation.  Infant with spontaneous cry, placed on mother's abdomen, dried and stimulated. Cord clamped x 2 after 1-minute delay, and cut by maternal mother. Cord blood drawn. Placenta delivered spontaneously with gentle cord traction. Appears intact. Fundus firm with massage and Pitocin. Labia, perineum, vagina, and cervix inspected with small hemostatic left labial abrasion not requiring repair.   APGAR: 9, 9; weight pending .   Cord: 3VC with the following complications:none.    Anesthesia:  Epidural Episiotomy: None Lacerations: Small left labial abrasion not requiring repair Est. Blood Loss (mL): 162  Mom to postpartum.  Baby to Couplet care / Skin to Skin.  Alicia Amel, MD 09/07/21 8:16 AM    ATTESTATION  I was present, gloved, and supervising throughout the delivery and agree with above documentation in the resident's note.  Allayne Stack, DO OB Fellow  [facility] [employer] ([facility]) 09/07/2021, 8:42 AM

## 2021-02-10 ENCOUNTER — Other Ambulatory Visit: Payer: Self-pay

## 2021-02-10 ENCOUNTER — Other Ambulatory Visit: Payer: Self-pay | Admitting: Student

## 2021-02-10 ENCOUNTER — Ambulatory Visit (INDEPENDENT_AMBULATORY_CARE_PROVIDER_SITE_OTHER): Payer: Self-pay

## 2021-02-10 ENCOUNTER — Inpatient Hospital Stay (HOSPITAL_COMMUNITY)
Admission: AD | Admit: 2021-02-10 | Discharge: 2021-02-10 | Disposition: A | Discharge status: Home / Self Care | Payer: Medicaid Other | Attending: Obstetrics and Gynecology | Admitting: Obstetrics and Gynecology

## 2021-02-10 VITALS — Wt 172.5 lb

## 2021-02-10 DIAGNOSIS — R112 Nausea with vomiting, unspecified: Secondary | ICD-10-CM | POA: Insufficient documentation

## 2021-02-10 DIAGNOSIS — Z3201 Encounter for pregnancy test, result positive: Secondary | ICD-10-CM

## 2021-02-10 DIAGNOSIS — Z3200 Encounter for pregnancy test, result unknown: Secondary | ICD-10-CM

## 2021-02-10 LAB — POCT PREGNANCY, URINE: Preg Test, Ur: POSITIVE — AB

## 2021-02-10 MED ORDER — ONDANSETRON 8 MG PO TBDP
8.0000 mg | ORAL_TABLET | Freq: Three times a day (TID) | ORAL | 1 refills | Status: DC | PRN
Start: 2021-02-10 — End: 2021-03-05

## 2021-02-10 NOTE — Progress Notes (Signed)
Patient Sadiyah T Zappone is a 25 y.o.  No obstetric history  at Unknown here with complaints of nausea and vomiting 3-4 times a day. She denies  Vaginal bleeding, vaginal discharge, abdominal pain or cramping. She has had three positive at home pregnancy tests. She would like a pregnancy test here and nausea medicine.   Explained to patient that we do not give pregnancy confirmation tests here; directed patient to [facility] for pregnancy test. Confirmed with [facility] front desk that patient can come now (Friday morning) for pregnancy test by 10 am. Patient given directions and will call an Benedetto Goad to take her there.   She requests medicine for nausea; will send antiemetics to patient's preferred pharmacy. Reviewed warning signs and when to come back to MAU.   All questions answered, patient left for [facility].   Luna Kitchens

## 2021-02-10 NOTE — MAU Provider Note (Signed)
Patient Regina Mckee is a 25 y.o.  No obstetric history  at Unknown here with complaints of nausea and vomiting 3-4 times a day. She denies  Vaginal bleeding, vaginal discharge, abdominal pain or cramping. She has had three positive at home pregnancy tests. She would like a pregnancy test here and nausea medicine.   Explained to patient that we do not give pregnancy confirmation tests here; directed patient to [facility] for pregnancy test. Confirmed with [facility] front desk that patient can come now (Friday morning) for pregnancy test by 10 am. Patient given directions and will call an Benedetto Goad to take her there.   She requests medicine for nausea; will send antiemetics to patient's preferred pharmacy. Reviewed warning signs and when to come back to MAU.   All questions answered, patient left for [facility].    Charlesetta Garibaldi Kooistra 02/10/2021, 9:27 AM

## 2021-02-10 NOTE — Patient Instructions (Signed)
Prenatal Care Providers           [facility]  [address] [phone]  [facility]   [address]  [phone]  [facility] @ [place]       [address] [phone]            [facility] @ [place]     [address] [phone]          [facility] @ [place]   [address] [phone]  [facility]  [address] [phone]     [facility]  [address]   [phone]     [facility]  Phone: [phone]  [facility]  Phone: [phone]  [facility] Phone: [phone]  [facility] Phone: [phone]  [facility] Phone: [phone]  [facility] Phone: [phone]  [facility]  Phone: [phone]  

## 2021-02-10 NOTE — MAU Note (Signed)
Regina Mckee is a 25 y.o.here in MAU reporting: 3 positive UPT at home. Having some morning sickness for the past 2 weeks. Would like pregnancy confirmation. No bleeding or pain.  LMP: 12/24/20  Onset of complaint: ongoing  Pain score: 0/10  Vitals:   02/10/21 0859  BP: 128/82  Pulse: 88  Resp: 15  Temp: 98.6 F (37 C)  SpO2: 99%     Lab orders placed from triage: none

## 2021-02-10 NOTE — Progress Notes (Signed)
Pt referred from MAU for UPT; result is positive. Reviewed dating with pt. Pt is 6w 6d based on LMP of 12/24/20; EDD 09/30/21. Reviewed medications and allergies with pt; list of medications safe to take during pregnancy given. Pt reports ongoing nausea and constipation. Prescription given for Zofran today at MAU. Encouraged pt to begin otc stool softener for constipation, explained Zofran may increase constipation. Recommended pt begin prenatal vitamin and schedule prenatal care. Pt would like to schedule with our office. Reviewed what to expect at first prenatal appt. MyChart downloaded with patient; encouraged pt to message with any concerns.  Fleet Contras RN 02/10/21

## 2021-02-13 NOTE — Progress Notes (Signed)
I have reviewed this chart and agree with the RN/CMA assessment and management.    K. Meryl Nekeya Briski, MD, FACOG Attending [facility])  

## 2021-03-05 ENCOUNTER — Inpatient Hospital Stay (HOSPITAL_COMMUNITY): Payer: Medicaid Other

## 2021-03-05 ENCOUNTER — Encounter (HOSPITAL_COMMUNITY): Payer: Self-pay | Admitting: Family Medicine

## 2021-03-05 ENCOUNTER — Inpatient Hospital Stay (HOSPITAL_COMMUNITY)
Admission: AD | Admit: 2021-03-05 | Discharge: 2021-03-05 | Disposition: A | Discharge status: Home / Self Care | Payer: Medicaid Other | Attending: Family Medicine | Admitting: Family Medicine

## 2021-03-05 ENCOUNTER — Other Ambulatory Visit: Payer: Self-pay

## 2021-03-05 DIAGNOSIS — O208 Other hemorrhage in early pregnancy: Secondary | ICD-10-CM | POA: Insufficient documentation

## 2021-03-05 DIAGNOSIS — O26851 Spotting complicating pregnancy, first trimester: Secondary | ICD-10-CM

## 2021-03-05 DIAGNOSIS — O23591 Infection of other part of genital tract in pregnancy, first trimester: Secondary | ICD-10-CM | POA: Insufficient documentation

## 2021-03-05 DIAGNOSIS — Z3A10 10 weeks gestation of pregnancy: Secondary | ICD-10-CM | POA: Insufficient documentation

## 2021-03-05 DIAGNOSIS — B9689 Other specified bacterial agents as the cause of diseases classified elsewhere: Secondary | ICD-10-CM | POA: Diagnosis not present

## 2021-03-05 DIAGNOSIS — O468X1 Other antepartum hemorrhage, first trimester: Secondary | ICD-10-CM | POA: Diagnosis not present

## 2021-03-05 DIAGNOSIS — O99891 Other specified diseases and conditions complicating pregnancy: Secondary | ICD-10-CM

## 2021-03-05 DIAGNOSIS — N760 Acute vaginitis: Secondary | ICD-10-CM

## 2021-03-05 DIAGNOSIS — Z3490 Encounter for supervision of normal pregnancy, unspecified, unspecified trimester: Secondary | ICD-10-CM

## 2021-03-05 HISTORY — DX: Essential (primary) hypertension: I10

## 2021-03-05 LAB — COMPREHENSIVE METABOLIC PANEL
ALT: 15 U/L (ref 0–44)
AST: 13 U/L — ABNORMAL LOW (ref 15–41)
Albumin: 3.6 g/dL (ref 3.5–5.0)
Alkaline Phosphatase: 64 U/L (ref 38–126)
Anion gap: 9 (ref 5–15)
BUN: 9 mg/dL (ref 6–20)
CO2: 20 mmol/L — ABNORMAL LOW (ref 22–32)
Calcium: 9.2 mg/dL (ref 8.9–10.3)
Chloride: 105 mmol/L (ref 98–111)
Creatinine, Ser: 0.52 mg/dL (ref 0.44–1.00)
GFR, Estimated: >60 mL/min (ref >60)
Glucose, Bld: 91 mg/dL (ref 70–99)
Potassium: 3.7 mmol/L (ref 3.5–5.1)
Sodium: 134 mmol/L — ABNORMAL LOW (ref 135–145)
Total Bilirubin: 0.6 mg/dL (ref 0.3–1.2)
Total Protein: 6.7 g/dL (ref 6.5–8.1)

## 2021-03-05 LAB — CBC
HCT: 38.3 % (ref 36.0–46.0)
Hemoglobin: 12.9 g/dL (ref 12.0–15.0)
MCH: 28.4 pg (ref 26.0–34.0)
MCHC: 33.7 g/dL (ref 30.0–36.0)
MCV: 84.2 fL (ref 80.0–100.0)
Platelets: 275 10*3/uL (ref 150–400)
RBC: 4.55 MIL/uL (ref 3.87–5.11)
RDW: 14 % (ref 11.5–15.5)
WBC: 11.7 10*3/uL — ABNORMAL HIGH (ref 4.0–10.5)
nRBC: 0 % (ref 0.0–0.2)

## 2021-03-05 LAB — URINALYSIS, ROUTINE W REFLEX MICROSCOPIC
Bilirubin Urine: NEGATIVE
Glucose, UA: NEGATIVE mg/dL
Hgb urine dipstick: NEGATIVE
Ketones, ur: NEGATIVE mg/dL
Leukocytes,Ua: NEGATIVE
Nitrite: NEGATIVE
Protein, ur: NEGATIVE mg/dL
Specific Gravity, Urine: 1.017 (ref 1.005–1.030)
pH: 6 (ref 5.0–8.0)

## 2021-03-05 LAB — WET PREP, GENITAL
Sperm: NONE SEEN
Trich, Wet Prep: NONE SEEN
WBC, Wet Prep HPF POC: NONE SEEN
Yeast Wet Prep HPF POC: NONE SEEN

## 2021-03-05 LAB — HCG, QUANTITATIVE, PREGNANCY: hCG, Beta Chain, Quant, S: 73176 m[IU]/mL — ABNORMAL HIGH (ref <5)

## 2021-03-05 LAB — ABO/RH: ABO/RH(D): O POS

## 2021-03-05 MED ORDER — PREPLUS 27-1 MG PO TABS: 1.0000 | ORAL_TABLET | Freq: Every day | ORAL | 13 refills | Status: DC

## 2021-03-05 MED ORDER — METRONIDAZOLE 500 MG PO TABS
500.0000 mg | ORAL_TABLET | Freq: Two times a day (BID) | ORAL | 0 refills | Status: DC
Start: 2021-03-05 — End: 2021-07-07

## 2021-03-05 NOTE — Discharge Instructions (Signed)
Safe Medications in Pregnancy   Acne: Benzoyl Peroxide Salicylic Acid  Backache/Headache: Tylenol: 2 regular strength every 4 hours OR              2 Extra strength every 6 hours  Colds/Coughs/Allergies: Benadryl (alcohol free) 25 mg every 6 hours as needed Breath right strips Claritin Cepacol throat lozenges Chloraseptic throat spray Cold-Eeze- up to three times per day Cough drops, alcohol free Flonase (by prescription only) Guaifenesin Mucinex Robitussin DM (plain only, alcohol free) Saline nasal spray/drops Sudafed (pseudoephedrine) & Actifed ** use only after [redacted] weeks gestation and if you do not have high blood pressure Tylenol Vicks Vaporub Zinc lozenges Zyrtec   Constipation: Colace Ducolax suppositories Fleet enema Glycerin suppositories Metamucil Milk of magnesia Miralax Senokot Smooth move tea  Diarrhea: Kaopectate Imodium A-D  *NO pepto Bismol  Hemorrhoids: Anusol Anusol HC Preparation H Tucks  Indigestion: Tums Maalox Mylanta Zantac  Pepcid  Insomnia: Benadryl (alcohol free) 25mg every 6 hours as needed Tylenol PM Unisom, no Gelcaps  Leg Cramps: Tums MagGel  Nausea/Vomiting:  Bonine Dramamine Emetrol Ginger extract Sea bands Meclizine  Nausea medication to take during pregnancy:  Unisom (doxylamine succinate 25 mg tablets) Take one tablet daily at bedtime. If symptoms are not adequately controlled, the dose can be increased to a maximum recommended dose of two tablets daily (1/2 tablet in the morning, 1/2 tablet mid-afternoon and one at bedtime). Vitamin B6 100mg tablets. Take one tablet twice a day (up to 200 mg per day).  Skin Rashes: Aveeno products Benadryl cream or 25mg every 6 hours as needed Calamine Lotion 1% cortisone cream  Yeast infection: Gyne-lotrimin 7 Monistat 7   **If taking multiple medications, please check labels to avoid duplicating the same active ingredients **take medication as directed on  the label ** Do not exceed 4000 mg of tylenol in 24 hours **Do not take medications that contain aspirin or ibuprofen     https://www.cdc.gov/pregnancy/infections.html">  First Trimester of Pregnancy  The first trimester of pregnancy starts on the first day of your last menstrual period until the end of week 12. This is also called months 1 through 3 of pregnancy. Body changes during your first trimester Your body goes through many changes during pregnancy. The changes usually return to normal after your baby is born. Physical changes  You may gain or lose weight.  Your breasts may grow larger and hurt. The area around your nipples may get darker.  Dark spots or blotches may develop on your face.  You may have changes in your hair. Health changes  You may feel like you might vomit (nauseous), and you may vomit.  You may have heartburn.  You may have headaches.  You may have trouble pooping (constipation).  Your gums may bleed. Other changes  You may get tired easily.  You may pee (urinate) more often.  Your menstrual periods will stop.  You may not feel hungry.  You may want to eat certain kinds of food.  You may have changes in your emotions from day to day.  You may have more dreams. Follow these instructions at home: Medicines  Take over-the-counter and prescription medicines only as told by your doctor. Some medicines are not safe during pregnancy.  Take a prenatal vitamin that contains at least 600 micrograms (mcg) of folic acid. Eating and drinking  Eat healthy meals that include: ? Fresh fruits and vegetables. ? Whole grains. ? Good sources of protein, such as meat, eggs, or tofu. ? Low-fat   dairy products.  Avoid raw meat and unpasteurized juice, milk, and cheese.  If you feel like you may vomit, or you vomit: ? Eat 4 or 5 small meals a day instead of 3 large meals. ? Try eating a few soda crackers. ? Drink liquids between meals instead of  during meals.  You may need to take these actions to prevent or treat trouble pooping: ? Drink enough fluids to keep your pee (urine) pale yellow. ? Eat foods that are high in fiber. These include beans, whole grains, and fresh fruits and vegetables. ? Limit foods that are high in fat and sugar. These include fried or sweet foods. Activity  Exercise only as told by your doctor. Most people can do their usual exercise routine during pregnancy.  Stop exercising if you have cramps or pain in your lower belly (abdomen) or low back.  Do not exercise if it is too hot or too humid, or if you are in a place of great height (high altitude).  Avoid heavy lifting.  If you choose to, you may have sex unless your doctor tells you not to. Relieving pain and discomfort  Wear a good support bra if your breasts are sore.  Rest with your legs raised (elevated) if you have leg cramps or low back pain.  If you have bulging veins (varicose veins) in your legs: ? Wear support hose as told by your doctor. ? Raise your feet for 15 minutes, 3-4 times a day. ? Limit salt in your food. Safety  Wear your seat belt at all times when you are in a car.  Talk with your doctor if someone is hurting you or yelling at you.  Talk with your doctor if you are feeling sad or have thoughts of hurting yourself. Lifestyle  Do not use hot tubs, steam rooms, or saunas.  Do not douche. Do not use tampons or scented sanitary pads.  Do not use herbal medicines, illegal drugs, or medicines that are not approved by your doctor. Do not drink alcohol.  Do not smoke or use any products that contain nicotine or tobacco. If you need help quitting, ask your doctor.  Avoid cat litter boxes and soil that is used by cats. These carry germs that can cause harm to the baby and can cause a loss of your baby by miscarriage or stillbirth. General instructions  Keep all follow-up visits. This is important.  Ask for help if you  need counseling or if you need help with nutrition. Your doctor can give you advice or tell you where to go for help.  Visit your dentist. At home, brush your teeth with a soft toothbrush. Floss gently.  Write down your questions. Take them to your prenatal visits. Where to find more information  American Pregnancy Association: americanpregnancy.org  [employer]necologists: www.acog.org  Office on Women's Health: KeywordPortfolios.com.br Contact a doctor if:  You are dizzy.  You have a fever.  You have mild cramps or pressure in your lower belly.  You have a nagging pain in your belly area.  You continue to feel like you may vomit, you vomit, or you have watery poop (diarrhea) for 24 hours or longer.  You have a bad-smelling fluid coming from your vagina.  You have pain when you pee.  You are exposed to a disease that spreads from person to person, such as chickenpox, measles, Zika virus, HIV, or hepatitis. Get help right away if:  You have spotting or  bleeding from your vagina.  You have very bad belly cramping or pain.  You have shortness of breath or chest pain.  You have any kind of injury, such as from a fall or a car crash.  You have new or increased pain, swelling, or redness in an arm or leg. Summary  The first trimester of pregnancy starts on the first day of your last menstrual period until the end of week 12 (months 1 through 3).  Eat 4 or 5 small meals a day instead of 3 large meals.  Do not smoke or use any products that contain nicotine or tobacco. If you need help quitting, ask your doctor.  Keep all follow-up visits. This information is not intended to replace advice given to you by your health care provider. Make sure you discuss any questions you have with your health care provider. Document Revised: 05/18/2020 Document Reviewed: 03/24/2020 Elsevier Patient Education  2021 Elsevier Inc.   Subchorionic  Hematoma  A hematoma is a collection of blood outside of the blood vessels. A subchorionic hematoma is a collection of blood between the outer wall of the embryo (chorion) and the inner wall of the uterus. This condition can cause vaginal bleeding. Early small hematomas usually shrink on their own and do not affect your baby or pregnancy. When bleeding starts later in pregnancy, or if the hematoma is larger or occurs in older pregnant women, the condition may be more serious. Larger hematomas increase the chances of miscarriage. This condition also increases the risk of:  Premature separation of the placenta from the uterus.  Premature (preterm) labor.  Stillbirth. What are the causes? The exact cause of this condition is not known. It occurs when blood is trapped between the placenta and the uterine wall because the placenta has separated from the original site of implantation. What increases the risk? You are more likely to develop this condition if:  You were treated with fertility medicines.  You became pregnant through in vitro fertilization (IVF). What are the signs or symptoms? Symptoms of this condition include:  Vaginal spotting or bleeding.  Abdominal pain. This is rare. Sometimes you may have no symptoms and the bleeding may only be seen when ultrasound images are taken (transvaginal ultrasound). How is this diagnosed? This condition is diagnosed based on a physical exam. This includes a pelvic exam. You may also have other tests, including:  Blood tests.  Urine tests.  Ultrasound of the abdomen. How is this treated? Treatment for this condition can vary. Treatment may include:  Watchful waiting. You will be monitored closely for any changes in bleeding.  Medicines.  Activity restriction. This may be needed until the bleeding stops.  A medicine called Rh immunoglobulin. This is given if you have an Rh-negative blood type. It prevents Rh sensitization. Follow  these instructions at home:  Stay on bed rest if told to do so by your health care provider.  Do not lift anything that is heavier than 10 lb (4.5 kg), or the limit that you are told by your health care provider.  Track and write down the number of pads you use each day and how soaked (saturated) they are.  Do not use tampons.  Keep all follow-up visits. This is important. Your health care provider may ask you to have follow-up blood tests or ultrasound tests or both. Contact a health care provider if:  You have any vaginal bleeding.  You have a fever. Get help right away if:  You  have severe cramps in your stomach, back, abdomen, or pelvis.  You pass large clots or tissue. Save any tissue for your health care provider to look at.  You faint.  You become light-headed or weak. Summary  A subchorionic hematoma is a collection of blood between the outer wall of the embryo (chorion) and the inner wall of the uterus.  This condition can cause vaginal bleeding.  Sometimes you may have no symptoms and the bleeding may only be seen when ultrasound images are taken.  Treatment may include watchful waiting, medicines, or activity restriction.  Keep all follow-up visits. Get help right away if you have severe cramps or heavy vaginal bleeding. This information is not intended to replace advice given to you by your health care provider. Make sure you discuss any questions you have with your health care provider. Document Revised: 09/05/2020 Document Reviewed: 09/05/2020 Elsevier Patient Education  2021 [employer].

## 2021-03-05 NOTE — MAU Note (Signed)
Pt presents to MAU with complaint of spotting last night; has not seen any spotting this morning,but wanted to come and make sure everything is okay. No LOF reported. Pain 5/10 on 1-10 scale.

## 2021-03-05 NOTE — MAU Provider Note (Signed)
History     CSN: [id number]  Arrival date and time: 03/05/21 0944   Event Date/Time   First Provider Initiated Contact with Patient 03/05/21 1010      Chief Complaint  Patient presents with  . Vaginal Bleeding   HPI Regina Mckee is a 25 y.o. G1P0 at [redacted]w[redacted]d who presents to MAU with chief complaint of vaginal spotting. This is a new problem, onset last night. She denies heavy vaginal bleeding. She is not wearing a pad or saturating her underwear.   Patient also c/o suprapubic cramping, onset coinciding with onset of spotting. At its most intense, pain score was 5/10, non-radiating. She denies aggravating or alleviating factors. She did not take medication or try other treatments for this complaint. Pain score on CNM initial assessment is 0/10.  Patient is remote from sexual intercourse. She denies abdominal tenderness, dysuria, fever.   OB History    Gravida  1   Para      Term      Preterm      AB      Living        SAB      IAB      Ectopic      Multiple      Live Births              Past Medical History:  Diagnosis Date  . Asthma   . Eczema   . History of ADHD     Past Surgical History:  Procedure Laterality Date  . NO PAST SURGERIES      Family History  Problem Relation Age of Onset  . Stroke Other   . Hypertension Other   . Cancer Other   . Diabetes Other   . Hypertension Father     Social History   Tobacco Use  . Smoking status: Never Smoker  . Smokeless tobacco: Never Used  Vaping Use  . Vaping Use: Never used  Substance Use Topics  . Alcohol use: Not Currently  . Drug use: Not Currently    Types: Marijuana    Allergies:  Allergies  Allergen Reactions  . Other Itching    Cherries cause throat to itch.    No medications prior to admission.    Review of Systems  Gastrointestinal: Positive for abdominal pain.  Genitourinary: Positive for vaginal bleeding.  All other systems reviewed and are  negative.  Physical Exam   Blood pressure 130/87, pulse 62, temperature 98.1 F (36.7 C), temperature source Oral, resp. rate 16, height 5\' 3"  (1.6 m), weight 79.7 kg, last menstrual period 12/24/2020, SpO2 100 %.  Physical Exam Vitals and nursing note reviewed.  Constitutional:      Appearance: Normal appearance.  Cardiovascular:     Rate and Rhythm: Normal rate.     Pulses: Normal pulses.     Heart sounds: Normal heart sounds.  Pulmonary:     Effort: Pulmonary effort is normal.     Breath sounds: Normal breath sounds.  Abdominal:     General: Abdomen is flat.     Tenderness: There is no abdominal tenderness. There is no right CVA tenderness or left CVA tenderness.  Genitourinary:    Comments: Deferred Skin:    Capillary Refill: Capillary refill takes less than 2 seconds.  Neurological:     Mental Status: She is alert and oriented to person, place, and time.  Psychiatric:        Mood and Affect: Mood normal.  Behavior: Behavior normal.        Thought Content: Thought content normal.        Judgment: Judgment normal.     MAU Course  Procedures  --No previous imaging  Orders Placed This Encounter  Procedures  . Wet prep, genital  . US OB Comp Less 14 Wks  . Urinalysis, Routine w reflex microscopic Urine, Clean Catch  . CBC  . Comprehensive metabolic panel  . hCG, quantitative, pregnancy  . Nursing communication  . ABO/Rh  . Discharge patient   Patient Vitals for the past 24 hrs:  BP Temp Temp src Pulse Resp SpO2 Height Weight  03/05/21 1133 120/82 98.8 F (37.1 C) Oral 74 16 100 % -- --  03/05/21 1015 130/87 98.1 F (36.7 C) Oral 62 16 100 % 5\' 3"  (1.6 m) 79.7 kg   Results for orders placed or performed during the hospital encounter of 03/05/21 (from the past 24 hour(s))  CBC     Status: Abnormal   Collection Time: 03/05/21 10:21 AM  Result Value Ref Range   WBC 11.7 (H) 4.0 - 10.5 K/uL   RBC 4.55 3.87 - 5.11 MIL/uL   Hemoglobin 12.9 12.0 - 15.0  g/dL   HCT 03/07/21 58.0 - 99.8 %   MCV 84.2 80.0 - 100.0 fL   MCH 28.4 26.0 - 34.0 pg   MCHC 33.7 30.0 - 36.0 g/dL   RDW 33.8 25.0 - 53.9 %   Platelets 275 150 - 400 K/uL   nRBC 0.0 0.0 - 0.2 %  Comprehensive metabolic panel     Status: Abnormal   Collection Time: 03/05/21 10:21 AM  Result Value Ref Range   Sodium 134 (L) 135 - 145 mmol/L   Potassium 3.7 3.5 - 5.1 mmol/L   Chloride 105 98 - 111 mmol/L   CO2 20 (L) 22 - 32 mmol/L   Glucose, Bld 91 70 - 99 mg/dL   BUN 9 6 - 20 mg/dL   Creatinine, Ser 03/07/21 0.44 - 1.00 mg/dL   Calcium 9.2 8.9 - 3.41 mg/dL   Total Protein 6.7 6.5 - 8.1 g/dL   Albumin 3.6 3.5 - 5.0 g/dL   AST 13 (L) 15 - 41 U/L   ALT 15 0 - 44 U/L   Alkaline Phosphatase 64 38 - 126 U/L   Total Bilirubin 0.6 0.3 - 1.2 mg/dL   GFR, Estimated 93.7 >90 mL/min   Anion gap 9 5 - 15  ABO/Rh     Status: None   Collection Time: 03/05/21 10:21 AM  Result Value Ref Range   ABO/RH(D) O POS    No rh immune globuloin      NOT A RH IMMUNE GLOBULIN CANDIDATE, PT RH POSITIVE Performed at [facility], [address], [place], [place]   hCG, quantitative, pregnancy     Status: Abnormal   Collection Time: 03/05/21 10:21 AM  Result Value Ref Range   hCG, Beta Chain, Quant, S 73,176 (H) <5 mIU/mL  Urinalysis, Routine w reflex microscopic Urine, Clean Catch     Status: Abnormal   Collection Time: 03/05/21 10:28 AM  Result Value Ref Range   Color, Urine YELLOW YELLOW   APPearance HAZY (A) CLEAR   Specific Gravity, Urine 1.017 1.005 - 1.030   pH 6.0 5.0 - 8.0   Glucose, UA NEGATIVE NEGATIVE mg/dL   Hgb urine dipstick NEGATIVE NEGATIVE   Bilirubin Urine NEGATIVE NEGATIVE   Ketones, ur NEGATIVE NEGATIVE mg/dL   Protein,  ur NEGATIVE NEGATIVE mg/dL   Nitrite NEGATIVE NEGATIVE   Leukocytes,Ua NEGATIVE NEGATIVE  Wet prep, genital     Status: Abnormal   Collection Time: 03/05/21 10:28 AM   Specimen: Urine, Clean Catch  Result Value Ref Range   Yeast Wet Prep HPF POC  NONE SEEN NONE SEEN   Trich, Wet Prep NONE SEEN NONE SEEN   Clue Cells Wet Prep HPF POC PRESENT (A) NONE SEEN   WBC, Wet Prep HPF POC NONE SEEN NONE SEEN   Sperm NONE SEEN    US OB Comp Less 14 Wks  Result Date: 03/05/2021 CLINICAL DATA:  Vaginal spotting EXAM: OBSTETRIC <14 WK ULTRASOUND TECHNIQUE: Transabdominal ultrasound was performed for evaluation of the gestation as well as the maternal uterus and adnexal regions. COMPARISON:  None. FINDINGS: Intrauterine gestational sac: Visualized-single Yolk sac:  Not visualized Embryo:  Visualized Cardiac Activity: Visualized Heart Rate: 162 bpm CRL:   32 mm   10 w 0 d                  [place] EDC: October 01, 2021 Subchorionic hemorrhage: There is a subchorionic hemorrhage measuring 1.3 x 0.8 cm. Maternal uterus/adnexae: Cervical os is closed. Right ovary measures 3.0 x 1.3 x 2.5 cm. Left ovary measures 2.1 x 1.4 by 2.2 cm. No extrauterine pelvic or adnexal mass. No free pelvic fluid. IMPRESSION: Single live intrauterine gestation with estimated gestational age of [redacted] weeks. Subchorionic hemorrhage measuring 1.3 x 0.8 cm. No extrauterine pelvic mass.  No free pelvic fluid. Electronically Signed   By: Bretta Bang III M.D.   On: 03/05/2021 11:03   Meds ordered this encounter  Medications  . metroNIDAZOLE (FLAGYL) 500 MG tablet    Sig: Take 1 tablet (500 mg total) by mouth 2 (two) times daily.    Dispense:  14 tablet    Refill:  0    Order Specific Question:   Supervising Provider    Answer:   Reva Bores [[id number]]  . Prenatal Vit-Fe Fumarate-FA (PREPLUS) 27-1 MG TABS    Sig: Take 1 tablet by mouth daily.    Dispense:  30 tablet    Refill:  13    Order Specific Question:   Supervising Provider    Answer:   Reva Bores [[id number]]   Assessment and Plan  --25 y.o. G1P0 with live IUP at [redacted]w[redacted]d  --Subchorionic Hematoma, pelvic rest advised --Bacterial Vaginosis --Blood type O POS --Discharge home in stable condition with first trimester  precautions  Calvert Cantor, CNM 03/05/2021, 2:03 PM

## 2021-03-06 LAB — GC/CHLAMYDIA PROBE AMP (~~LOC~~) NOT AT ARMC
Chlamydia: NEGATIVE
Comment: NEGATIVE
Comment: NORMAL
Neisseria Gonorrhea: NEGATIVE

## 2021-03-19 ENCOUNTER — Inpatient Hospital Stay (HOSPITAL_COMMUNITY)
Admission: AD | Admit: 2021-03-19 | Discharge: 2021-03-19 | Disposition: A | Discharge status: Home / Self Care | Payer: Medicaid Other | Attending: Obstetrics & Gynecology | Admitting: Obstetrics & Gynecology

## 2021-03-19 DIAGNOSIS — Z3A12 12 weeks gestation of pregnancy: Secondary | ICD-10-CM

## 2021-03-19 DIAGNOSIS — O26891 Other specified pregnancy related conditions, first trimester: Secondary | ICD-10-CM | POA: Diagnosis not present

## 2021-03-19 DIAGNOSIS — O26899 Other specified pregnancy related conditions, unspecified trimester: Secondary | ICD-10-CM

## 2021-03-19 DIAGNOSIS — R102 Pelvic and perineal pain: Secondary | ICD-10-CM | POA: Diagnosis not present

## 2021-03-19 DIAGNOSIS — R109 Unspecified abdominal pain: Secondary | ICD-10-CM | POA: Insufficient documentation

## 2021-03-19 DIAGNOSIS — R1030 Lower abdominal pain, unspecified: Secondary | ICD-10-CM

## 2021-03-19 NOTE — Discharge Instructions (Signed)

## 2021-03-19 NOTE — MAU Note (Signed)
Pt reports to mau with c/o sharp pain that wraps around to her sides for the past 2 days.  Denies vag bleeding or cramping.

## 2021-03-19 NOTE — MAU Provider Note (Signed)
History     CSN: [id number]  Arrival date and time: 03/19/21 0935   Event Date/Time   First Provider Initiated Contact with Patient 03/19/21 [phone]      Chief Complaint  Patient presents with  . Abdominal Pain   24 y.o. G1 @12 .1 wks presenting with abdominal pain. Started 2 days ago. Located bilateral and lower abdomen near groin area. Describes as sharp and constant, and "discomfort not pain". Denies VB or discharge. No urinary sx. Denies GI sx. Denies recent lifting, pushing, pulling, or injury. Appears anxious stating "this is my first and I wanted to make sure everything was ok".   OB History    Gravida  1   Para      Term      Preterm      AB      Living        SAB      IAB      Ectopic      Multiple      Live Births              Past Medical History:  Diagnosis Date  . Asthma   . Eczema   . History of ADHD   . Hypertension     Past Surgical History:  Procedure Laterality Date  . NO PAST SURGERIES      Family History  Problem Relation Age of Onset  . Stroke Other   . Hypertension Other   . Cancer Other   . Diabetes Other   . Hypertension Father     Social History   Tobacco Use  . Smoking status: Never Smoker  . Smokeless tobacco: Never Used  Vaping Use  . Vaping Use: Never used  Substance Use Topics  . Alcohol use: Not Currently  . Drug use: Not Currently    Types: Marijuana    Allergies:  Allergies  Allergen Reactions  . Other Itching    Cherries cause throat to itch.    Medications Prior to Admission  Medication Sig Dispense Refill Last Dose  . metroNIDAZOLE (FLAGYL) 500 MG tablet Take 1 tablet (500 mg total) by mouth 2 (two) times daily. 14 tablet 0   . Prenatal Vit-Fe Fumarate-FA (PREPLUS) 27-1 MG TABS Take 1 tablet by mouth daily. 30 tablet 13     Review of Systems  Constitutional: Negative for chills and fever.  Gastrointestinal: Positive for abdominal pain. Negative for constipation, diarrhea, nausea and  vomiting.  Genitourinary: Negative for dysuria, frequency, urgency, vaginal bleeding and vaginal discharge.   Physical Exam   Blood pressure 133/77, pulse (!) 58, temperature 97.6 F (36.4 C), temperature source Oral, resp. rate 17, height 5\' 3"  (1.6 m), weight 78.8 kg, last menstrual period 12/24/2020, SpO2 100 %.  Physical Exam Vitals and nursing note reviewed. Exam conducted with a chaperone present.  Constitutional:      General: She is not in acute distress.    Appearance: Normal appearance.  HENT:     Head: Normocephalic and atraumatic.  Pulmonary:     Effort: Pulmonary effort is normal. No respiratory distress.  Abdominal:     General: There is no distension.     Palpations: Abdomen is soft. There is no mass.     Tenderness: There is no abdominal tenderness. There is no guarding or rebound.     Hernia: No hernia is present.  Musculoskeletal:        General: Normal range of motion.     Cervical back: Normal  range of motion.  Skin:    General: Skin is warm and dry.  Neurological:     General: No focal deficit present.     Mental Status: She is alert and oriented to person, place, and time.  Psychiatric:        Mood and Affect: Mood is anxious.   Limited bedside US: viable, active fetus, FHR 162, subj. nml AFV  No results found for this or any previous visit (from the past 24 hour(s)).  MAU Course  Procedures  MDM No signs of acute process. Pt reassured. Pain likely physiologic to pregnancy. Discussed comfort measures. Stable for discharge home.   Assessment and Plan   1. [redacted] weeks gestation of pregnancy   2. Pain of round ligament during pregnancy    Discharge home Follow up at [facility] as scheduled SAB precautions Tylenol prn  Allergies as of 03/19/2021      Reactions   Other Itching   Cherries cause throat to itch.      Medication List    TAKE these medications   metroNIDAZOLE 500 MG tablet Commonly known as: Flagyl Take 1 tablet (500 mg total) by mouth  2 (two) times daily.   PrePLUS 27-1 MG Tabs Take 1 tablet by mouth daily.      Donette Larry, CNM 03/19/2021, 10:10 AM

## 2021-03-31 ENCOUNTER — Ambulatory Visit (INDEPENDENT_AMBULATORY_CARE_PROVIDER_SITE_OTHER): Payer: Medicaid Other | Admitting: Obstetrics and Gynecology

## 2021-03-31 ENCOUNTER — Encounter: Payer: Self-pay | Admitting: Obstetrics and Gynecology

## 2021-03-31 ENCOUNTER — Other Ambulatory Visit: Payer: Self-pay

## 2021-03-31 ENCOUNTER — Other Ambulatory Visit (HOSPITAL_COMMUNITY)
Admission: RE | Admit: 2021-03-31 | Discharge: 2021-03-31 | Disposition: A | Discharge status: Home / Self Care | Payer: Medicaid Other | Source: Ambulatory Visit | Attending: Obstetrics and Gynecology | Admitting: Obstetrics and Gynecology

## 2021-03-31 VITALS — BP 119/69 | HR 61 | Wt 171.0 lb

## 2021-03-31 DIAGNOSIS — Z23 Encounter for immunization: Secondary | ICD-10-CM

## 2021-03-31 DIAGNOSIS — O0990 Supervision of high risk pregnancy, unspecified, unspecified trimester: Secondary | ICD-10-CM | POA: Insufficient documentation

## 2021-03-31 DIAGNOSIS — Z31430 Encounter of female for testing for genetic disease carrier status for procreative management: Secondary | ICD-10-CM | POA: Diagnosis not present

## 2021-03-31 DIAGNOSIS — B373 Candidiasis of vulva and vagina: Secondary | ICD-10-CM | POA: Diagnosis not present

## 2021-03-31 DIAGNOSIS — Z6830 Body mass index (BMI) 30.0-30.9, adult: Secondary | ICD-10-CM | POA: Insufficient documentation

## 2021-03-31 DIAGNOSIS — O10919 Unspecified pre-existing hypertension complicating pregnancy, unspecified trimester: Secondary | ICD-10-CM | POA: Diagnosis not present

## 2021-03-31 DIAGNOSIS — E669 Obesity, unspecified: Secondary | ICD-10-CM | POA: Insufficient documentation

## 2021-03-31 DIAGNOSIS — O99210 Obesity complicating pregnancy, unspecified trimester: Secondary | ICD-10-CM

## 2021-03-31 DIAGNOSIS — B3731 Acute candidiasis of vulva and vagina: Secondary | ICD-10-CM

## 2021-03-31 LAB — POCT URINALYSIS DIP (DEVICE)
Bilirubin Urine: NEGATIVE
Glucose, UA: NEGATIVE mg/dL
Hgb urine dipstick: NEGATIVE
Ketones, ur: NEGATIVE mg/dL
Leukocytes,Ua: NEGATIVE
Nitrite: NEGATIVE
Protein, ur: NEGATIVE mg/dL
Specific Gravity, Urine: 1.02 (ref 1.005–1.030)
Urobilinogen, UA: 0.2 mg/dL (ref 0.0–1.0)
pH: 7.5 (ref 5.0–8.0)

## 2021-03-31 MED ORDER — ASPIRIN EC 81 MG PO TBEC
81.0000 mg | DELAYED_RELEASE_TABLET | Freq: Every day | ORAL | 1 refills | Status: DC
Start: 2021-03-31 — End: 2021-08-04

## 2021-03-31 MED ORDER — MICONAZOLE NITRATE 2 % VA CREA: 1.0000 | TOPICAL_CREAM | Freq: Every day | VAGINAL | 2 refills | Status: DC

## 2021-03-31 MED ORDER — BLOOD PRESSURE MONITOR DEVI: 1.0000 | 0 refills | Status: DC | PRN

## 2021-03-31 NOTE — Patient Instructions (Signed)
AREA PEDIATRIC/FAMILY PRACTICE PHYSICIANS  [place] ([address]) . [facility] o Chambliss, MD; Eniola, MD; Hale, MD; Hensel, MD; McDiarmid, MD; McIntyer, MD; Neal, MD; Walden, MD o [address] [place], [place] [address] o [phone] o Mon-Fri 8:30-12:30, 1:30-5:00 o Providers come to see babies at [facility] o Accepting Medicaid . [facility] o Limited providers who accept newborns: Koirala, MD; Morrow, MD; Wolters, MD o [address], [place], [place] [address] o [phone] o Mon-Fri 8:00-5:30 o Babies seen by providers at [facility] o Does NOT accept Medicaid o Please call early in hospitalization for appointment (limited availability)  . [facility] o Mulberry, MD o [address] [place], [place] [address] o [phone] o Mon, Tue, Thur, Fri 8:30-5:00, Wed 10:00-7:00 (closed 1-2pm) o Babies seen by [facility] providers o Accepting Medicaid . [facility] o Rubin, MD o [address], [place], [place] [address] o [phone] o Mon-Fri 8:30-5:00, Sat 8:30-12:00 o Provider comes to see babies at [facility] o Accepting Medicaid o Must have been referred from current patients or contacted office prior to delivery . [facility] ([facility] o Brown, MD; Chandler, MD; Ettefagh, MD; Grant, MD; Lester, MD; McCormick, MD; McQueen, MD; Prose, MD; Simha, MD; Stanley, MD; Stryffeler, NP; Tebben, NP o [address], [place], [place] [address] o [phone] o Mon, Tue, Thur, Fri 8:30-5:30, Wed 9:30-5:30, Sat 8:30-12:30 o Babies seen by [facility] providers o Accepting Medicaid o Only accepting infants of first-time parents or siblings of current patients o Hospital discharge coordinator will make follow-up appointment . Jack Amos o [address],  [place], [place]  [address] o [phone]   Fax - [phone] . [facility] o [address], [place], [place]  [address] o Phone - [phone]   Fax - [phone] . Shilpa Gosrani o [address], [place], [place]  [address] o [phone]  [place] ([address]) . [facility] o Bates, MD; Brassfield, MD; Cooper, Cox, MD; MD; Davis, MD; Dovico, MD; Ettefaugh, MD; Little, MD; Lowe, MD; Keiffer, MD; Melvin, MD; Sumner, MD; Williams, MD o [address], [place], [place] [address] o [phone] o Mon-Fri 8:30-5:00 (extended evenings Mon-Thur as needed), Sat-Sun 10:00-1:00 o Providers come to see babies at [facility] o Accepting Medicaid for families of first-time babies and families with all children in the household age 3 and under. Must register with office prior to making appointment (M-F only). . [facility] o Henson, NP; Knapp, MD; Lalonde, MD; Tysinger, PA o [address] [place], [place] [address] o [phone] o Mon-Fri 8:00-5:00 o Babies seen by providers at [facility] o Does NOT accept Medicaid/Commercial Insurance Only . [facility] ([facility]  o Artis, MD; Barnes, MD; Bratton, MD; Coccaro, MD; Lockett Gardner, MD; Kramer, MD; Marshall, MD; Netherton, MD; Poleto, MD; Skinner, MD o [address] [place], [place] [address] o [phone] o Mon-Fri 8:30-5:30, Sat (Oct.-Mar.) 9:00-1:00 o Babies seen by providers at [facility] o Accepting Medicaid  [place] ([address]) . [facility] o Reid, MD; Warner, MD o [address], [place], [place] [address] o [phone] o Mon-Fri 8:30-5:00, Sat 8:30-12:00 o Providers come to see babies at [facility] o Does NOT accept Medicaid . [facility] o Becker, PA; Hagler, MD; Scifres, PA; Sun, MD; Swayne, MD o [address],  [place], [place] [address] o [phone] o Mon-Fri 8:00-5:00 o Babies seen by providers at [facility] o Does NOT accept Medicaid o Only accepting babies of parents who are patients o Please call early in hospitalization for appointment (limited availability) . [facility] o Clark, MD; Frye, MD; Kelleher, MD; Mack, NP; Miller, MD; O'Keller, MD; Patterson, NP; Pudlo, MD; Puzio, MD; Thomas, MD; Tucker, MD; Twiselton, MD o [address], [place], [place] [address] o [phone] o Mon-Fri 8:00-5:00, Sat 9:00-12:00 o Providers come to see babies at [facility] o Does NOT accept Medicaid  [place] ([address]) . [facility] o Limited providers accepting new patients: Brake, NP; Wharton, PA o [address], [place], [place] [address] o [phone] o Mon-Fri 8:00-5:00 o Babies seen by providers at [facility] o Does NOT accept Medicaid o Only accepting babies of parents who are patients o Please call early in hospitalization for appointment (limited availability) . [facility] o Gay, MD; Quinlan, MD o [address] [place], [place] [address] o [phone] (press 1 to schedule appointment) o Mon-Fri 8:00-5:00 o Providers come to see babies at [facility] o Does NOT accept Medicaid . [facility] o Mazer, MD o [address] [place], [place] [address] o [phone] o Mon-Fri 8:30-5:00 (lunch 12:30-1:00), extended hours by appointment only Wed 5:00-6:30 o Babies seen by [facility] providers o Accepting Medicaid . [facility] o Banks, MD; Jordan, MD; Koberlein, MD o [address], [place], [place] [address] o [phone] o Mon-Fri 8:00-5:00 o Babies seen by [facility] providers o Does NOT accept Medicaid . [facility] o Parker, MD; Hunter, MD; Wallace, DO o [address] [place], [place]  [address] o [phone] o Mon-Fri 8:00-5:00 o Babies seen by [facility] providers o Does NOT accept Medicaid . [facility] o Brandon, PA; Brecken, PA; Christy, NP; Dees, MD; DeClaire, MD; DeWeese, MD; Hansen, NP; Mills, NP; Parrish, NP; Smoot, NP; Summer, MD; Vapne, MD o [address] [place], [place] [address] o [phone] o Mon-Fri 8:30-5:00, Sat 10:00-1:00 o Providers come to see babies at [facility] o Does NOT accept Medicaid o Free prenatal information session Tuesdays at 4:45pm . [facility] o Bouska, MD; Gordon, PA; Jeffery, PA; Weber, PA o [address] [place] [address] o [phone] o Mon-Fri 7:30-5:30 o Babies seen by [facility] providers . [facility] o [address], [place], [place]  [address] o [phone]   Fax - [phone]  [place] ([address] & [address]) . [facility] o Reese, MD o [address] [place], [place] [address] o [phone] o Mon-Thur 8:00-6:00 o Providers come to see babies at [facility] o Accepting Medicaid . [facility] o Anderson, NP; Badger, MD; Beal, PA; Spencer, PA o [address] [place], [place] [address] o [phone] o Mon-Thur 7:30-7:30, Fri 7:30-4:30 o Babies seen by [facility] providers o Accepting Medicaid . [facility] o Agbuya, MD; Klett, NP; Romgoolam, MD o [address], [place],  [address] o [phone] o Mon-Fri 8:30-5:00, Sat 8:30-12:00 o Providers come to see babies at [facility] o Accepting Medicaid o Must have "Meet & Greet" appointment at office prior to delivery . [facility] o McCord,   MD; Wallace, MD; Wood, MD o [address], [place], [place] [address] o [phone] o Mon-Wed 8:00-6:00, Thur-Fri 8:00-5:00, Sat 9:00-12:00 o Providers come to  see babies at [facility] o Does NOT accept Medicaid o Only accepting siblings of current patients . [facility]  o [address], [place], [place]  [address] o [phone]   Fax - [phone] . [facility] o [address], [place], [place]  [address] o [phone]   Fax - [phone]  [place] ([address] & [address]) . [facility] o Cirigliano, DO; Matthews, DO o [address] [place], [place] [address] o [phone] o Mon-Fri 7:00-5:00 o Babies seen by [facility] providers o Does NOT accept Medicaid . [facility] o Briscoe, MD; Howley, PA; Moreira, PA o [address], [place], [place] [address] o [phone] o Mon-Fri 8:00-5:00 o Babies seen by [facility] providers o Accepting Medicaid . [facility] o Boyd, MD; Church, PA; Jones, NP; Osborn, PA o [address], [place], [place] [address] o [phone] o Mon-Fri 8:00-5:00 o Babies seen by providers at [facility] o Accepting Medicaid  [place] ([address]) . [facility] [place] o Wendling, DO o [address] [place], [place] [address] o [phone] o Mon-Fri 8:00-5:00 o Babies seen by [facility] providers o Does NOT accept Medicaid o Limited availability, please call early in hospitalization to schedule follow-up . [facility] o Calderon, PA; Cummings, MD; Dillard, MD; Martin, PA; Olson, MD; VanDeven, PA o [address], [place], [place] [address] o [phone] o Mon-Fri 8:30-5:00, Sat 9:00-12:00 o Babies seen by providers at [facility] o Accepting Medicaid o Please register online then schedule online or call office o [email or web address] . [facility] o Hunter, NP; Kumar, MD; Martin Rogers, PA o [address], [place], [place] [address] o [phone] o Mon-Fri 8:00-5:00 o Babies seen by providers at [facility] o Accepting Medicaid . [facility] ([facility] o Lynch, MD; Kristi Fleenor, NP; West, MD o [address], [place], [place] [address] o [phone] o Mon-Fri 8:00-5:30, Sat&Sun by appointment (phones open at 8:30) o Babies seen by [facility] providers o Accepting Medicaid o Must be a first-time baby or sibling of current patient . [facility] [place]  o [address], [place], [place]  [address] o [phone]   Fax - [phone]  [place] ([address]) . [facility] o Brown, PA; Cowen, PA; Rice, MD; Helton, PA; Spry, MD o [address] [place], [place] [address] o [phone] o Mon-Thur 8:00-7:00, Fri 8:00-5:00, Sat 8:00-12:00, Sun 9:00-12:00 o Babies seen by [facility] providers o Accepting Medicaid . [facility] [place] o Coe-Goins, MD; Marshall, MD; Pierre-Louis, MD o [address] [place] [address], [place],  [address] o [phone] o Mon-Thur 8:00-5:00 o Babies seen by providers at [facility] o Accepting Medicaid . [facility] o Bratton, MD; Coe-Goins, MD; Hayes, MD; Lewis, MD; List, MD; Lott, MD; Marshall, MD; Moran, MD; O'Neal, MD; Pierre-Louis, MD; Pitonzo, MD; Scholer, MD; Spangle, MD o [address] [place],    [address] o [phone] o Mon-Fri 8:00-5:30, Sat (Oct.-Mar.) 9:00-1:00 o Babies seen by providers at [facility] o Accepting Medicaid o Must fill out new patient packet, available online at [email or web address] . [facility] ([facility]) o Friddle, NP; Harris, NP; Kelly, NP; Logan, MD;  Melvin, PA; Poth, MD; Ramadoss, MD; Stanton, NP o [address], [place], [place] [address] o [phone] o Mon-Thur 8:00-5:30, Fri 8:00-5:00 o Babies seen by providers at [facility] o Accepting Medicaid  [place] ([address]) . [facility] o Dixon, PA; Arrington, MD; Pickard, MD; Tapia, PA o [address], [place], [place] [address] o [phone] o Mon-Fri 8:00-5:00 o Babies seen by providers at [facility] o Accepting Medicaid   [place] ([address]) . [facility] [place] o Masneri, DO; [place]ers, MD; Nelson, PA o [address], [place], [place] [address] o [phone] o Mon-Fri 8:00-5:00 o Babies seen by providers at [facility] o Does NOT accept Medicaid o Limited appointment availability, please call early in hospitalization  . [facility] [place] o Kunedd, DO; McGowen, MD o [address], [place], [place] [address] o [phone] o Mon-Fri 8:00-5:00 o Babies seen by [facility] providers o Does NOT accept Medicaid . [facility] [place] o Cameron, MD; MacDonald, MD; Michaels, PA; Nayak, MD o [address] [place] [address], [place], [place] [address] o [phone] o Mon-Fri 8:00-5:00 o After hours clinic ([address] [place], [place] [address]) [phone] Mon-Fri 5:00-8:00, Sat 12:00-6:00, Sun 10:00-4:00 o Babies seen by [facility] providers o Accepting Medicaid . [facility] [place] o [address], [place], [place]  [address] o [phone]   Fax - [phone]  [place] ([address]) . [facility] [place] [facility] o Andy, MD o [address], [place], [place] [address] o [phone] o Mon-Fri 8:00-5:00 o Babies seen by [facility] providers o Does NOT accept Medicaid . [facility] [place] ([facility] [place]) o Eksir, MD o [address], [place], [place]  [address] o [phone] o Mon-Thur 8:00-7:00, Fri 8:00-5:00, Sat 8:00-12:00 o Babies seen by providers at [facility] o Accepting Medicaid - but does not have vaccinations in office (must be received elsewhere) o Limited availability, please call early in hospitalization  [place] ([address]) . [facility]  o Charlene Flemming, MD o [address],  [place] [address] o [phone]  Fax [phone]   

## 2021-03-31 NOTE — Addendum Note (Signed)
Addended byVidal Schwalbe on: 03/31/2021 10:38 AM   Modules accepted: Orders

## 2021-03-31 NOTE — Progress Notes (Signed)
Medicaid Pregnancy Home form filled out. Gave patient Flu shot.

## 2021-03-31 NOTE — Progress Notes (Signed)
New OB Note  03/31/2021   Clinic: [facility]  Chief Complaint: New OB  Transfer of Care Patient: no  History of Present Illness: Ms. Regina Mckee is a 25 y.o. G1P0 @ 13/6 weeks (EDC 10/8, based on Patient's last menstrual period was 12/24/2020 (exact date).10wk u/s).  Preg complicated by has Chronic hypertension during pregnancy, antepartum; Supervision of high risk pregnancy, antepartum; BMI 30s; and Obesity in pregnancy on their problem list.   Any events prior to today's visit: 3/13 mau visit for vb and dx with a [place] hemorrhage, 10/0 SLIUP with normal FHR Her periods were: qmonth, regular She was using oral contraceptives (estrogen/progesterone) when she conceived.  She has mild signs or symptoms of nausea/vomiting of pregnancy. She has Negative signs or symptoms of miscarriage or preterm labor  ROS: A 12-point review of systems was performed and negative, except as stated in the above HPI.  OBGYN History: As per HPI. OB History  Gravida Para Term Preterm AB Living  1            SAB IAB Ectopic Multiple Live Births               # Outcome Date GA Lbr Len/2nd Weight Sex Delivery Anes PTL Lv  1 Current               Past Medical History: Past Medical History:  Diagnosis Date  . Asthma   . Eczema   . History of ADHD   . Hypertension     Past Surgical History: Past Surgical History:  Procedure Laterality Date  . NO PAST SURGERIES      Family History:  Family History  Problem Relation Age of Onset  . Stroke Other   . Hypertension Other   . Cancer Other   . Diabetes Other   . Hypertension Father   . Diabetes Father     Social History:  Social History   Socioeconomic History  . Marital status: Single    Spouse name: Not on file  . Number of children: Not on file  . Years of education: Not on file  . Highest education level: Not on file  Occupational History  . Not on file  Tobacco Use  . Smoking status: Never  Smoker  . Smokeless tobacco: Never Used  Vaping Use  . Vaping Use: Never used  Substance and Sexual Activity  . Alcohol use: Not Currently  . Drug use: Not Currently    Types: Marijuana  . Sexual activity: Yes    Birth control/protection: None  Other Topics Concern  . Not on file  Social History Narrative  . Not on file   Social Determinants of Health   Financial Resource Strain: Not on file  Food Insecurity: No Food Insecurity  . Worried About Programme researcher, broadcasting/film/video in the Last Year: Never true  . Ran Out of Food in the Last Year: Never true  Transportation Needs: No Transportation Needs  . Lack of Transportation (Medical): No  . Lack of Transportation (Non-Medical): No  Physical Activity: Not on file  Stress: Not on file  Social Connections: Not on file  Intimate Partner Violence: Not on file    Allergy: Allergies  Allergen Reactions  . Other Itching    Cherries cause throat to itch.    Health Maintenance:  Mammogram Up to Date: not applicable  Current Outpatient Medications: Prenatal vitamin  Physical Exam:   BP 119/69   Pulse 61   Wt  171 lb (77.6 kg)   LMP 12/24/2020 (Exact Date)   BMI 30.29 kg/m  Body mass index is 30.29 kg/m. Contractions: Not present Vag. Bleeding: None. Fundal height: not applicable FHTs: 160s  General appearance: Well nourished, well developed female in no acute distress.  Neck:  Supple, normal appearance, and no thyromegaly  Cardiovascular: S1, S2 normal, no murmur, rub or gallop, regular rate and rhythm Respiratory:  Clear to auscultation bilateral. Normal respiratory effort Abdomen: positive bowel sounds and no masses, hernias; diffusely non tender to palpation, non distended Breasts: pt declines any breast s/s. Neuro/Psych:  Normal mood and affect.  Skin:  Warm and dry.  Lymphatic:  No inguinal lymphadenopathy.   Pelvic exam: is not limited by body habitus EGBUS: within normal limits, Vagina: within normal limits and with  no blood in the vault, +white cottage cheese like d/c in the vault Cervix: normal appearing cervix without discharge or lesions, closed/long/high, Uterus:  enlarged, c/w 14 week size, and Adnexa:  normal adnexa and no mass, fullness, tenderness  Laboratory: reviewed  Imaging:  reviewed  Assessment: pt doing well  Plan: 1. Supervision of high risk pregnancy, antepartum Routine care. Pt amenable to start a low dose asa. Offer afp nv - Cytology - PAP( [place]) - CHL AMB BABYSCRIPTS SCHEDULE OPTIMIZATION - Culture, OB Urine - Genetic Screening - CBC/D/Plt+RPR+Rh+ABO+Rub Ab... - Hemoglobin A1c - Protein / creatinine ratio, urine - TSH - [place] MFM OB DETAIL +14 WK; Future  2. Vulvovaginal candidiasis OTC monistat 7  3. cHTN Doing well on no meds  Problem list reviewed and updated.  Follow up in 3 weeks.  The nature of [facility] with multiple MDs and other Advanced Practice Providers was explained to patient; also emphasized that residents, students are part of our team.  >50% of 30 min visit spent on counseling and coordination of care.     Cornelia Copa MD Attending Center for [facility])

## 2021-04-01 LAB — CBC/D/PLT+RPR+RH+ABO+RUB AB...
Antibody Screen: NEGATIVE
Basophils Absolute: 0 10*3/uL (ref 0.0–0.2)
Basos: 0 %
EOS (ABSOLUTE): 0.1 10*3/uL (ref 0.0–0.4)
Eos: 1 %
HCV Ab: <0.1 s/co ratio (ref 0.0–0.9)
HIV Screen 4th Generation wRfx: NONREACTIVE
Hematocrit: 37.4 % (ref 34.0–46.6)
Hemoglobin: 12.4 g/dL (ref 11.1–15.9)
Hepatitis B Surface Ag: NEGATIVE
Immature Grans (Abs): 0 10*3/uL (ref 0.0–0.1)
Immature Granulocytes: 0 %
Lymphocytes Absolute: 2.4 10*3/uL (ref 0.7–3.1)
Lymphs: 20 %
MCH: 28.7 pg (ref 26.6–33.0)
MCHC: 33.2 g/dL (ref 31.5–35.7)
MCV: 87 fL (ref 79–97)
Monocytes Absolute: 0.7 10*3/uL (ref 0.1–0.9)
Monocytes: 6 %
Neutrophils Absolute: 8.6 10*3/uL — ABNORMAL HIGH (ref 1.4–7.0)
Neutrophils: 73 %
Platelets: 254 10*3/uL (ref 150–450)
RBC: 4.32 x10E6/uL (ref 3.77–5.28)
RDW: 13.7 % (ref 11.7–15.4)
RPR Ser Ql: NONREACTIVE
Rh Factor: POSITIVE
Rubella Antibodies, IGG: 3.53 index (ref >0.99)
WBC: 11.9 10*3/uL — ABNORMAL HIGH (ref 3.4–10.8)

## 2021-04-01 LAB — HEMOGLOBIN A1C
Est. average glucose Bld gHb Est-mCnc: 100 mg/dL
Hgb A1c MFr Bld: 5.1 % (ref 4.8–5.6)

## 2021-04-01 LAB — TSH: TSH: 1.04 u[IU]/mL (ref 0.450–4.500)

## 2021-04-01 LAB — PROTEIN / CREATININE RATIO, URINE
Creatinine, Urine: 136.6 mg/dL
Protein, Ur: 9.9 mg/dL
Protein/Creat Ratio: 72 mg/g creat (ref 0–200)

## 2021-04-01 LAB — HCV INTERPRETATION

## 2021-04-02 LAB — CULTURE, OB URINE

## 2021-04-02 LAB — URINE CULTURE, OB REFLEX

## 2021-04-03 ENCOUNTER — Encounter: Payer: Self-pay | Admitting: *Deleted

## 2021-04-04 LAB — CYTOLOGY - PAP
Chlamydia: NEGATIVE
Comment: NEGATIVE
Comment: NORMAL
Diagnosis: NEGATIVE
Neisseria Gonorrhea: NEGATIVE

## 2021-04-11 ENCOUNTER — Encounter: Payer: Self-pay | Admitting: General Practice

## 2021-04-21 ENCOUNTER — Ambulatory Visit (INDEPENDENT_AMBULATORY_CARE_PROVIDER_SITE_OTHER): Payer: Medicaid Other | Admitting: Obstetrics and Gynecology

## 2021-04-21 VITALS — BP 132/69 | HR 63 | Wt 176.8 lb

## 2021-04-21 DIAGNOSIS — O99210 Obesity complicating pregnancy, unspecified trimester: Secondary | ICD-10-CM

## 2021-04-21 DIAGNOSIS — E669 Obesity, unspecified: Secondary | ICD-10-CM

## 2021-04-21 DIAGNOSIS — O10919 Unspecified pre-existing hypertension complicating pregnancy, unspecified trimester: Secondary | ICD-10-CM

## 2021-04-21 DIAGNOSIS — Z3A16 16 weeks gestation of pregnancy: Secondary | ICD-10-CM

## 2021-04-21 DIAGNOSIS — O0990 Supervision of high risk pregnancy, unspecified, unspecified trimester: Secondary | ICD-10-CM

## 2021-04-21 NOTE — Progress Notes (Signed)
Occasional sharp pains in pelvic but denies any vaginal bleeding. Patient stated that she is doing good.

## 2021-04-21 NOTE — Progress Notes (Signed)
   PRENATAL VISIT NOTE  Subjective:  Regina Mckee is a 25 y.o. G1P0 at [redacted]w[redacted]d being seen today for ongoing prenatal care.  She is currently monitored for the following issues for this high-risk pregnancy and has Chronic hypertension during pregnancy, antepartum; Supervision of high risk pregnancy, antepartum; BMI 30s; and Obesity in pregnancy on their problem list.  Patient reports no complaints.  Contractions: Not present. Vag. Bleeding: None.  Movement: Present. Denies leaking of fluid.    The following portions of the patient's history were reviewed and updated as appropriate: allergies, current medications, past family history, past medical history, past social history, past surgical history and problem list.   Objective:   Vitals:   04/21/21 0926  BP: 132/69  Pulse: 63  Weight: 176 lb 12.8 oz (80.2 kg)    Fetal Status: Fetal Heart Rate (bpm): 156   Movement: Present     General:  Alert, oriented and cooperative. Patient is in no acute distress.  Skin: Skin is warm and dry. No rash noted.   Cardiovascular: Normal heart rate noted  Respiratory: Normal respiratory effort, no problems with respiration noted  Abdomen: Soft, gravid, appropriate for gestational age.  Pain/Pressure: Absent     Pelvic: Cervical exam deferred        Extremities: Normal range of motion.  Edema: None  Mental Status: Normal mood and affect. Normal behavior. Normal judgment and thought content.   Assessment and Plan:  Pregnancy: G1P0 at [redacted]w[redacted]d 1. Supervision of high risk pregnancy, antepartum -doing well overall. Anatomy scan 5/16 -AFP today   2. Chronic hypertension during pregnancy, antepartum -BP normal today, not on meds. She has not started aspirin. Emphasized importance of starting daily aspirin and she verbalizes understanding  3. Obesity in pregnancy   4. BMI 30s   5. [redacted] weeks gestation of pregnancy   Preterm labor symptoms and general obstetric precautions including but  not limited to vaginal bleeding, contractions, leaking of fluid and fetal movement were reviewed in detail with the patient. Please refer to After Visit Summary for other counseling recommendations.   No follow-ups on file.  Future Appointments  Date Time Provider Department Center  04/21/2021  9:35 AM Gita Kudo, MD [facility]  05/08/2021  1:30 PM [facility] NURSE [facility]  05/08/2021  1:45 PM [facility] US5 [facility]    Gita Kudo, MD

## 2021-04-21 NOTE — Patient Instructions (Signed)
Alpha-Fetoprotein Test Why am I having this test? The alpha-fetoprotein test is a lab test most commonly used for pregnant women to help screen for birth defects in their unborn baby. It can be used to screen for chromosome (DNA) abnormalities, problems with the brain or spinal cord, or problems with the abdominal wall of the unborn baby (fetus). The alpha-fetoprotein test may also be done for men or nonpregnant women to check for certain cancers. What is being tested? This test measures the amount of alpha-fetoprotein (AFP) in your blood. AFP is a protein that is made by the liver. Levels can be detected in the mother's blood during pregnancy, starting at 10 weeks and peaking at 16-18 weeks of the pregnancy. Abnormal levels can sometimes be a sign of a birth defect in the baby. Certain cancers can cause a high level of AFP in men and nonpregnant women. What kind of sample is taken? A blood sample is required for this test. It is usually collected by inserting a needle into a blood vessel.   How are the results reported? Your test results will be reported as values. Your health care provider will compare your results to normal ranges that were established after testing a large group of people (reference values). Reference values may vary among labs and hospitals. For this test, common reference values are:  Adult: Less than 40 ng/mL or less than 40 mcg/L (SI units).  Child younger than 1 year: Less than 30 ng/mL. If you are pregnant, the values may also vary based on how long you have been pregnant. What do the results mean? Results that are above the reference values in pregnant women may indicate the following for the baby:  Neural tube defects, such as abnormalities of the spinal cord or brain.  Abdominal wall defects.  Multiple pregnancy such as twins.  Fetal distress or fetal death. Results that are above the reference values in men or nonpregnant women may indicate:  Reproductive  cancers, such as ovarian or testicular cancer.  Liver cancer.  Liver cell death.  Other types of cancer. Very low levels of AFP in pregnant women may indicate Down syndrome for the baby. Talk with your health care provider about what your results mean. Questions to ask your health care provider Ask your health care provider, or the department that is doing the test:  When will my results be ready?  How will I get my results?  What are my treatment options?  What other tests do I need?  What are my next steps? Summary  The alpha-fetoprotein test is done on pregnant women to help screen for birth defects in their unborn baby.  Certain cancers can cause a high level of AFP in men and nonpregnant women.  For this test, a blood sample is usually collected by inserting a needle into a blood vessel.  Talk with your health care provider about what your results mean. This information is not intended to replace advice given to you by your health care provider. Make sure you discuss any questions you have with your health care provider. Document Revised: 07/01/2020 Document Reviewed: 07/01/2020 Elsevier Patient Education  2021 Elsevier Inc.  

## 2021-04-23 LAB — AFP, SERUM, OPEN SPINA BIFIDA
AFP MoM: 1.73
AFP Value: 64.7 ng/mL
Gest. Age on Collection Date: 16.6 weeks
Maternal Age At EDD: 24.9 yr
OSBR Risk 1 IN: 3028
Test Results:: NEGATIVE
Weight: 177 [lb_av]

## 2021-05-08 ENCOUNTER — Ambulatory Visit: Payer: Medicaid Other | Attending: Obstetrics and Gynecology

## 2021-05-08 ENCOUNTER — Other Ambulatory Visit: Payer: Self-pay | Admitting: *Deleted

## 2021-05-08 ENCOUNTER — Ambulatory Visit: Payer: Medicaid Other | Admitting: *Deleted

## 2021-05-08 ENCOUNTER — Other Ambulatory Visit: Payer: Self-pay

## 2021-05-08 VITALS — BP 120/65 | HR 78

## 2021-05-08 DIAGNOSIS — O0990 Supervision of high risk pregnancy, unspecified, unspecified trimester: Secondary | ICD-10-CM | POA: Insufficient documentation

## 2021-05-08 DIAGNOSIS — I10 Essential (primary) hypertension: Secondary | ICD-10-CM

## 2021-05-08 DIAGNOSIS — O10919 Unspecified pre-existing hypertension complicating pregnancy, unspecified trimester: Secondary | ICD-10-CM

## 2021-05-19 ENCOUNTER — Ambulatory Visit (INDEPENDENT_AMBULATORY_CARE_PROVIDER_SITE_OTHER): Payer: Medicaid Other | Admitting: Advanced Practice Midwife

## 2021-05-19 VITALS — BP 111/74 | HR 89 | Wt 177.8 lb

## 2021-05-19 DIAGNOSIS — O0990 Supervision of high risk pregnancy, unspecified, unspecified trimester: Secondary | ICD-10-CM

## 2021-05-19 DIAGNOSIS — Z3A20 20 weeks gestation of pregnancy: Secondary | ICD-10-CM

## 2021-05-19 DIAGNOSIS — O10919 Unspecified pre-existing hypertension complicating pregnancy, unspecified trimester: Secondary | ICD-10-CM

## 2021-05-19 MED ORDER — BLOOD PRESSURE MONITOR DEVI: 1.0000 | 0 refills | Status: DC | PRN

## 2021-05-19 NOTE — Progress Notes (Signed)
   PRENATAL VISIT NOTE  Subjective:  Regina Mckee is a 25 y.o. G1P0 at [redacted]w[redacted]d being seen today for ongoing prenatal care.  She is currently monitored for the following issues for this high-risk pregnancy and has Chronic hypertension during pregnancy, antepartum; Supervision of high risk pregnancy, antepartum; BMI 30s; and Obesity in pregnancy on their problem list.  Patient reports no complaints.  Contractions: Not present. Vag. Bleeding: None.  Movement: Present. Denies leaking of fluid.   The following portions of the patient's history were reviewed and updated as appropriate: allergies, current medications, past family history, past medical history, past social history, past surgical history and problem list.   Objective:   Vitals:   05/19/21 0949  BP: 111/74  Pulse: 89  Weight: 177 lb 12.8 oz (80.6 kg)    Fetal Status: Fetal Heart Rate (bpm): 150   Movement: Present     General:  Alert, oriented and cooperative. Patient is in no acute distress.  Skin: Skin is warm and dry. No rash noted.   Cardiovascular: Normal heart rate noted  Respiratory: Normal respiratory effort, no problems with respiration noted  Abdomen: Soft, gravid, appropriate for gestational age.  Pain/Pressure: Present     Pelvic: Cervical exam deferred        Extremities: Normal range of motion.  Edema: None  Mental Status: Normal mood and affect. Normal behavior. Normal judgment and thought content.   Assessment and Plan:  Pregnancy: G1P0 at [redacted]w[redacted]d 1. Supervision of high risk pregnancy, antepartum --Anticipatory guidance about next visits/weeks of pregnancy given. --Reviewed normal [place] results, follow up scheduled next week to complete anatomy --Next visit in 4 weeks --Has questions about transportation, waiver given by RN  2. Chronic hypertension during pregnancy, antepartum --BP wnl today   3. [redacted] weeks gestation of pregnancy   Preterm labor symptoms and general obstetric precautions  including but not limited to vaginal bleeding, contractions, leaking of fluid and fetal movement were reviewed in detail with the patient. Please refer to After Visit Summary for other counseling recommendations.   Return in about 4 weeks (around 06/16/2021).  Future Appointments  Date Time Provider Department Center  05/29/2021  9:15 AM [facility] NURSE [facility]  05/29/2021  9:30 AM [facility] US3 [facility]    Sharen Counter, CNM

## 2021-05-19 NOTE — Patient Instructions (Addendum)
If you are in need of transportation to get to and from your appointments in our office.  You can reach Transportation Services by calling [phone] Monday - Friday  7am-6pm.    Second Trimester of Pregnancy  The second trimester of pregnancy is from week 13 through week 27. This is months 4 through 6 of pregnancy. The second trimester is often a time when you feel your best. Your body has adjusted to being pregnant, and you begin to feel better physically. During the second trimester:  Morning sickness has lessened or stopped completely.  You may have more energy.  You may have an increase in appetite. The second trimester is also a time when the unborn baby (fetus) is growing rapidly. At the end of the sixth month, the fetus may be up to 12 inches long and weigh about 1 pounds. You will likely begin to feel the baby move (quickening) between 16 and 20 weeks of pregnancy. Body changes during your second trimester Your body continues to go through many changes during your second trimester. The changes vary and generally return to normal after the baby is born. Physical changes  Your weight will continue to increase. You will notice your lower abdomen bulging out.  You may begin to get stretch marks on your hips, abdomen, and breasts.  Your breasts will continue to grow and to become tender.  Dark spots or blotches (chloasma or mask of pregnancy) may develop on your face.  A dark line from your belly button to the pubic area (linea nigra) may appear.  You may have changes in your hair. These can include thickening of your hair, rapid growth, and changes in texture. Some people also have hair loss during or after pregnancy, or hair that feels dry or thin. Health changes  You may develop headaches.  You may have heartburn.  You may develop constipation.  You may develop hemorrhoids or swollen, bulging veins (varicose veins).  Your gums may bleed and may be sensitive to  brushing and flossing.  You may urinate more often because the fetus is pressing on your bladder.  You may have back pain. This is caused by: ? Weight gain. ? Pregnancy hormones that are relaxing the joints in your pelvis. ? A shift in weight and the muscles that support your balance. Follow these instructions at home: Medicines  Follow your health care provider's instructions regarding medicine use. Specific medicines may be either safe or unsafe to take during pregnancy. Do not take any medicines unless approved by your health care provider.  Take a prenatal vitamin that contains at least 600 micrograms (mcg) of folic acid. Eating and drinking  Eat a healthy diet that includes fresh fruits and vegetables, whole grains, good sources of protein such as meat, eggs, or tofu, and low-fat dairy products.  Avoid raw meat and unpasteurized juice, milk, and cheese. These carry germs that can harm you and your baby.  You may need to take these actions to prevent or treat constipation: ? Drink enough fluid to keep your urine pale yellow. ? Eat foods that are high in fiber, such as beans, whole grains, and fresh fruits and vegetables. ? Limit foods that are high in fat and processed sugars, such as fried or sweet foods. Activity  Exercise only as directed by your health care provider. Most people can continue their usual exercise routine during pregnancy. Try to exercise for 30 minutes at least 5 days a week. Stop exercising if you develop contractions  in your uterus.  Stop exercising if you develop pain or cramping in the lower abdomen or lower back.  Avoid exercising if it is very hot or humid or if you are at a high altitude.  Avoid heavy lifting.  If you choose to, you may have sex unless your health care provider tells you not to. Relieving pain and discomfort  Wear a supportive bra to prevent discomfort from breast tenderness.  Take warm sitz baths to soothe any pain or discomfort  caused by hemorrhoids. Use hemorrhoid cream if your health care provider approves.  Rest with your legs raised (elevated) if you have leg cramps or low back pain.  If you develop varicose veins: ? Wear support hose as told by your health care provider. ? Elevate your feet for 15 minutes, 3-4 times a day. ? Limit salt in your diet. Safety  Wear your seat belt at all times when driving or riding in a car.  Talk with your health care provider if someone is verbally or physically abusive to you. Lifestyle  Do not use hot tubs, steam rooms, or saunas.  Do not douche. Do not use tampons or scented sanitary pads.  Avoid cat litter boxes and soil used by cats. These carry germs that can cause birth defects in the baby and possibly loss of the fetus by miscarriage or stillbirth.  Do not use herbal remedies, alcohol, illegal drugs, or medicines that are not approved by your health care provider. Chemicals in these products can harm your baby.  Do not use any products that contain nicotine or tobacco, such as cigarettes, e-cigarettes, and chewing tobacco. If you need help quitting, ask your health care provider. General instructions  During a routine prenatal visit, your health care provider will do a physical exam and other tests. He or she will also discuss your overall health. Keep all follow-up visits. This is important.  Ask your health care provider for a referral to a local prenatal education class.  Ask for help if you have counseling or nutritional needs during pregnancy. Your health care provider can offer advice or refer you to specialists for help with various needs. Where to find more information  American Pregnancy Association: americanpregnancy.org  [employer]: https://www.todd-brady.net/  Office on [employer] [facility]: [employer].nz Contact a health care provider if you have:  A headache that does not go away when  you take medicine.  Vision changes or you see spots in front of your eyes.  Mild pelvic cramps, pelvic pressure, or nagging pain in the abdominal area.  Persistent nausea, vomiting, or diarrhea.  A bad-smelling vaginal discharge or foul-smelling urine.  Pain when you urinate.  Sudden or extreme swelling of your face, hands, ankles, feet, or legs.  A fever. Get help right away if you:  Have fluid leaking from your vagina.  Have spotting or bleeding from your vagina.  Have severe abdominal cramping or pain.  Have difficulty breathing.  Have chest pain.  Have fainting spells.  Have not felt your baby move for the time period told by your health care provider.  Have new or increased pain, swelling, or redness in an arm or leg. Summary  The second trimester of pregnancy is from week 13 through week 27 (months 4 through 6).  Do not use herbal remedies, alcohol, illegal drugs, or medicines that are not approved by your health care provider. Chemicals in these products can harm your baby.  Exercise only as directed by your  health care provider. Most people can continue their usual exercise routine during pregnancy.  Keep all follow-up visits. This is important. This information is not intended to replace advice given to you by your health care provider. Make sure you discuss any questions you have with your health care provider. Document Revised: 05/18/2020 Document Reviewed: 03/24/2020 Elsevier Patient Education  2021 [employer].

## 2021-05-29 ENCOUNTER — Ambulatory Visit: Payer: Medicaid Other | Attending: Obstetrics and Gynecology

## 2021-05-29 ENCOUNTER — Encounter: Payer: Self-pay | Admitting: *Deleted

## 2021-05-29 ENCOUNTER — Other Ambulatory Visit: Payer: Self-pay | Admitting: *Deleted

## 2021-05-29 ENCOUNTER — Ambulatory Visit: Payer: Medicaid Other | Admitting: *Deleted

## 2021-05-29 ENCOUNTER — Other Ambulatory Visit: Payer: Self-pay

## 2021-05-29 VITALS — BP 135/71 | HR 61

## 2021-05-29 DIAGNOSIS — Z3A22 22 weeks gestation of pregnancy: Secondary | ICD-10-CM | POA: Diagnosis not present

## 2021-05-29 DIAGNOSIS — Z362 Encounter for other antenatal screening follow-up: Secondary | ICD-10-CM | POA: Diagnosis not present

## 2021-05-29 DIAGNOSIS — O10912 Unspecified pre-existing hypertension complicating pregnancy, second trimester: Secondary | ICD-10-CM

## 2021-05-29 DIAGNOSIS — O10919 Unspecified pre-existing hypertension complicating pregnancy, unspecified trimester: Secondary | ICD-10-CM | POA: Insufficient documentation

## 2021-05-29 DIAGNOSIS — O10012 Pre-existing essential hypertension complicating pregnancy, second trimester: Secondary | ICD-10-CM | POA: Diagnosis not present

## 2021-06-22 ENCOUNTER — Other Ambulatory Visit: Payer: Self-pay

## 2021-06-22 ENCOUNTER — Ambulatory Visit (INDEPENDENT_AMBULATORY_CARE_PROVIDER_SITE_OTHER): Payer: Medicaid Other | Admitting: Obstetrics and Gynecology

## 2021-06-22 VITALS — BP 120/72 | HR 78 | Wt 180.1 lb

## 2021-06-22 DIAGNOSIS — O99210 Obesity complicating pregnancy, unspecified trimester: Secondary | ICD-10-CM

## 2021-06-22 DIAGNOSIS — O0990 Supervision of high risk pregnancy, unspecified, unspecified trimester: Secondary | ICD-10-CM

## 2021-06-22 DIAGNOSIS — O99212 Obesity complicating pregnancy, second trimester: Secondary | ICD-10-CM

## 2021-06-22 DIAGNOSIS — O10919 Unspecified pre-existing hypertension complicating pregnancy, unspecified trimester: Secondary | ICD-10-CM

## 2021-06-22 DIAGNOSIS — O10912 Unspecified pre-existing hypertension complicating pregnancy, second trimester: Secondary | ICD-10-CM

## 2021-06-22 DIAGNOSIS — O0992 Supervision of high risk pregnancy, unspecified, second trimester: Secondary | ICD-10-CM

## 2021-06-22 DIAGNOSIS — Z3A25 25 weeks gestation of pregnancy: Secondary | ICD-10-CM

## 2021-06-22 DIAGNOSIS — E669 Obesity, unspecified: Secondary | ICD-10-CM

## 2021-06-22 NOTE — Progress Notes (Signed)
     PRENATAL VISIT NOTE  Subjective:  Regina Mckee is a 25 y.o. G1P0 at [redacted]w[redacted]d being seen today for ongoing prenatal care.  She is currently monitored for the following issues for this high-risk pregnancy and has Chronic hypertension during pregnancy, antepartum; Supervision of high risk pregnancy, antepartum; BMI 30s; and Obesity in pregnancy on their problem list.  Patient reports no complaints.  Contractions: Irritability. Vag. Bleeding: None.  Movement: Present. Denies leaking of fluid.   The following portions of the patient's history were reviewed and updated as appropriate: allergies, current medications, past family history, past medical history, past social history, past surgical history and problem list.   Objective:   Vitals:   06/22/21 0932  BP: 120/72  Pulse: 78  Weight: 180 lb 1.6 oz (81.7 kg)    Fetal Status: Fetal Heart Rate (bpm): 150   Movement: Present     General:  Alert, oriented and cooperative. Patient is in no acute distress.  Skin: Skin is warm and dry. No rash noted.   Cardiovascular: Normal heart rate noted  Respiratory: Normal respiratory effort, no problems with respiration noted  Abdomen: Soft, gravid, appropriate for gestational age.  Pain/Pressure: Present     Pelvic: Cervical exam deferred        Extremities: Normal range of motion.  Edema: Trace  Mental Status: Normal mood and affect. Normal behavior. Normal judgment and thought content.   Assessment and Plan:  Pregnancy: G1P0 at [redacted]w[redacted]d 1. [redacted] weeks gestation of pregnancy 28wk labs nv  2. Chronic hypertension during pregnancy, antepartum Doing well on no meds. Pt declines low dose asa Continue with qmonth growth u/s only since on no meds. Has rpt in july -6/6: 17%, 441gm, ac 38%, nl afi  3. Supervision of high risk pregnancy, antepartum  4. BMI 30s  5. Obesity in pregnancy  Preterm labor symptoms and general obstetric precautions including but not limited to vaginal  bleeding, contractions, leaking of fluid and fetal movement were reviewed in detail with the patient. Please refer to After Visit Summary for other counseling recommendations.   Return in about 2 weeks (around 07/06/2021) for in person, fasting 2hr GTT, high risk ob, md or app.  Future Appointments  Date Time Provider Department Center  06/27/2021  8:30 AM [facility] NURSE [facility]  06/27/2021  8:45 AM [facility] US5 [facility]    Page Bing, MD

## 2021-06-27 ENCOUNTER — Ambulatory Visit: Payer: Medicaid Other | Attending: Obstetrics and Gynecology

## 2021-06-27 ENCOUNTER — Other Ambulatory Visit: Payer: Self-pay

## 2021-06-27 ENCOUNTER — Other Ambulatory Visit: Payer: Self-pay | Admitting: *Deleted

## 2021-06-27 ENCOUNTER — Ambulatory Visit: Payer: Medicaid Other | Admitting: *Deleted

## 2021-06-27 ENCOUNTER — Encounter: Payer: Self-pay | Admitting: *Deleted

## 2021-06-27 VITALS — BP 116/66 | HR 58

## 2021-06-27 DIAGNOSIS — Z362 Encounter for other antenatal screening follow-up: Secondary | ICD-10-CM | POA: Diagnosis not present

## 2021-06-27 DIAGNOSIS — O10012 Pre-existing essential hypertension complicating pregnancy, second trimester: Secondary | ICD-10-CM

## 2021-06-27 DIAGNOSIS — O10912 Unspecified pre-existing hypertension complicating pregnancy, second trimester: Secondary | ICD-10-CM

## 2021-06-27 DIAGNOSIS — Z3A26 26 weeks gestation of pregnancy: Secondary | ICD-10-CM

## 2021-07-07 ENCOUNTER — Other Ambulatory Visit: Payer: Self-pay

## 2021-07-07 ENCOUNTER — Encounter: Payer: Self-pay | Admitting: Family Medicine

## 2021-07-07 ENCOUNTER — Other Ambulatory Visit: Payer: Medicaid Other

## 2021-07-07 ENCOUNTER — Ambulatory Visit (INDEPENDENT_AMBULATORY_CARE_PROVIDER_SITE_OTHER): Payer: Medicaid Other | Admitting: Family Medicine

## 2021-07-07 VITALS — BP 128/73 | HR 72 | Wt 179.5 lb

## 2021-07-07 DIAGNOSIS — O99210 Obesity complicating pregnancy, unspecified trimester: Secondary | ICD-10-CM

## 2021-07-07 DIAGNOSIS — O10919 Unspecified pre-existing hypertension complicating pregnancy, unspecified trimester: Secondary | ICD-10-CM

## 2021-07-07 DIAGNOSIS — Z23 Encounter for immunization: Secondary | ICD-10-CM

## 2021-07-07 DIAGNOSIS — O0990 Supervision of high risk pregnancy, unspecified, unspecified trimester: Secondary | ICD-10-CM | POA: Diagnosis not present

## 2021-07-07 DIAGNOSIS — Z5941 Food insecurity: Secondary | ICD-10-CM

## 2021-07-07 NOTE — Patient Instructions (Signed)

## 2021-07-07 NOTE — Progress Notes (Signed)
   Subjective:  Regina Mckee is a 25 y.o. G1P0 at [redacted]w[redacted]d being seen today for ongoing prenatal care.  She is currently monitored for the following issues for this high-risk pregnancy and has Chronic hypertension during pregnancy, antepartum; Supervision of high risk pregnancy, antepartum; BMI 30s; and Obesity in pregnancy on their problem list.  Patient reports no complaints.  Contractions: Not present. Vag. Bleeding: None.  Movement: Present. Denies leaking of fluid.   The following portions of the patient's history were reviewed and updated as appropriate: allergies, current medications, past family history, past medical history, past social history, past surgical history and problem list. Problem list updated.  Objective:   Vitals:   07/07/21 0835 07/07/21 0851  BP: (!) 141/87 128/73  Pulse: 74 72  Weight: 179 lb 8 oz (81.4 kg)     Fetal Status: Fetal Heart Rate (bpm): 154   Movement: Present     General:  Alert, oriented and cooperative. Patient is in no acute distress.  Skin: Skin is warm and dry. No rash noted.   Cardiovascular: Normal heart rate noted  Respiratory: Normal respiratory effort, no problems with respiration noted  Abdomen: Soft, gravid, appropriate for gestational age. Pain/Pressure: Present     Pelvic: Vag. Bleeding: None     Cervical exam deferred        Extremities: Normal range of motion.  Edema: None  Mental Status: Normal mood and affect. Normal behavior. Normal judgment and thought content.   Urinalysis:      Assessment and Plan:  Pregnancy: G1P0 at [redacted]w[redacted]d  1. Supervision of high risk pregnancy, antepartum BP mildly elevated on first check On recheck, BP normal FHR normal 28wk labs and TDaP today  2. Chronic hypertension during pregnancy, antepartum Previously declined prenatal ASA Following w MFM  3. Obesity in pregnancy   Preterm labor symptoms and general obstetric precautions including but not limited to vaginal bleeding,  contractions, leaking of fluid and fetal movement were reviewed in detail with the patient. Please refer to After Visit Summary for other counseling recommendations.  Return in 2 weeks (on 07/21/2021).   Venora Maples, MD

## 2021-07-08 LAB — CBC
Hematocrit: 35.9 % (ref 34.0–46.6)
Hemoglobin: 11.1 g/dL (ref 11.1–15.9)
MCH: 27.2 pg (ref 26.6–33.0)
MCHC: 30.9 g/dL — ABNORMAL LOW (ref 31.5–35.7)
MCV: 88 fL (ref 79–97)
Platelets: 234 10*3/uL (ref 150–450)
RBC: 4.08 x10E6/uL (ref 3.77–5.28)
RDW: 12.4 % (ref 11.7–15.4)
WBC: 12 10*3/uL — ABNORMAL HIGH (ref 3.4–10.8)

## 2021-07-08 LAB — GLUCOSE TOLERANCE, 2 HOURS W/ 1HR
Glucose, 1 hour: 121 mg/dL (ref 65–179)
Glucose, 2 hour: 88 mg/dL (ref 65–152)
Glucose, Fasting: 82 mg/dL (ref 65–91)

## 2021-07-08 LAB — HIV ANTIBODY (ROUTINE TESTING W REFLEX): HIV Screen 4th Generation wRfx: NONREACTIVE

## 2021-07-08 LAB — RPR: RPR Ser Ql: NONREACTIVE

## 2021-07-25 ENCOUNTER — Ambulatory Visit (INDEPENDENT_AMBULATORY_CARE_PROVIDER_SITE_OTHER): Payer: Medicaid Other | Admitting: Family Medicine

## 2021-07-25 ENCOUNTER — Other Ambulatory Visit: Payer: Self-pay

## 2021-07-25 VITALS — BP 128/79 | HR 81 | Wt 180.8 lb

## 2021-07-25 DIAGNOSIS — O0990 Supervision of high risk pregnancy, unspecified, unspecified trimester: Secondary | ICD-10-CM

## 2021-07-25 NOTE — Progress Notes (Signed)
    Subjective:  Regina Mckee is a 25 y.o. G1P0 at [redacted]w[redacted]d being seen today for ongoing prenatal care.  She is currently monitored for the following issues for this high-risk pregnancy and has Chronic hypertension during pregnancy, antepartum; Supervision of high risk pregnancy, antepartum; BMI 30s; and Obesity in pregnancy on their problem list.  Patient reports no complaints.  Contractions: Not present. Vag. Bleeding: None.  Movement: Present. Denies leaking of fluid.   The following portions of the patient's history were reviewed and updated as appropriate: allergies, current medications, past family history, past medical history, past social history, past surgical history and problem list. Problem list updated.  Objective:   Vitals:   07/25/21 0820  BP: 128/79  Pulse: 81  Weight: 180 lb 12.8 oz (82 kg)    Fetal Status: Fetal Heart Rate (bpm): 146 Fundal Height: 31 cm Movement: Present     General:  Alert, oriented and cooperative. Patient is in no acute distress.  Skin: Skin is warm and dry. No rash noted.   Cardiovascular: Normal heart rate noted  Respiratory: Normal respiratory effort, no problems with respiration noted  Abdomen: Soft, gravid, appropriate for gestational age. Pain/Pressure: Absent     Pelvic: Vag. Bleeding: None     Cervical exam deferred        Extremities: Normal range of motion.  Edema: Trace  Mental Status: Normal mood and affect. Normal behavior. Normal judgment and thought content.    Assessment and Plan:  Pregnancy: G1P0 at [redacted]w[redacted]d  1. Supervision of high risk pregnancy, antepartum Continue routine prenatal care Would like depo for birth control in post partum Follow up in 2 weeks  2. Hx of chronic HTN - not on medications BP normotensive today (128/79), no headaches, blurry vision, or abdominal pain. Reports stopped taking ASA. Discussed restating and discussed indication for prevention of preE.  -Has [place] next week and patient  aware  Preterm labor symptoms and general obstetric precautions including but not limited to vaginal bleeding, contractions, leaking of fluid and fetal movement were reviewed in detail with the patient. Please refer to After Visit Summary for other counseling recommendations.  Return in about 2 weeks (around 08/08/2021) for HROB, MD/DO preferred.  Warner Mccreedy, MD, MPH OB Fellow, Faculty Practice

## 2021-08-01 ENCOUNTER — Encounter: Payer: Self-pay | Admitting: *Deleted

## 2021-08-01 ENCOUNTER — Ambulatory Visit: Payer: Medicaid Other | Attending: Obstetrics

## 2021-08-01 ENCOUNTER — Other Ambulatory Visit: Payer: Self-pay | Admitting: *Deleted

## 2021-08-01 ENCOUNTER — Ambulatory Visit: Payer: Medicaid Other | Admitting: *Deleted

## 2021-08-01 ENCOUNTER — Other Ambulatory Visit: Payer: Self-pay

## 2021-08-01 VITALS — BP 141/78 | HR 96

## 2021-08-01 DIAGNOSIS — O10913 Unspecified pre-existing hypertension complicating pregnancy, third trimester: Secondary | ICD-10-CM | POA: Diagnosis present

## 2021-08-01 DIAGNOSIS — O10013 Pre-existing essential hypertension complicating pregnancy, third trimester: Secondary | ICD-10-CM | POA: Diagnosis not present

## 2021-08-01 DIAGNOSIS — Z3A31 31 weeks gestation of pregnancy: Secondary | ICD-10-CM

## 2021-08-01 DIAGNOSIS — Z362 Encounter for other antenatal screening follow-up: Secondary | ICD-10-CM

## 2021-08-01 DIAGNOSIS — O10912 Unspecified pre-existing hypertension complicating pregnancy, second trimester: Secondary | ICD-10-CM | POA: Diagnosis present

## 2021-08-04 ENCOUNTER — Inpatient Hospital Stay (HOSPITAL_COMMUNITY)
Admission: AD | Admit: 2021-08-04 | Discharge: 2021-08-04 | Disposition: A | Discharge status: Home / Self Care | Payer: Medicaid Other | Attending: Obstetrics & Gynecology | Admitting: Obstetrics & Gynecology

## 2021-08-04 ENCOUNTER — Encounter (HOSPITAL_COMMUNITY): Payer: Self-pay | Admitting: Obstetrics & Gynecology

## 2021-08-04 ENCOUNTER — Other Ambulatory Visit: Payer: Self-pay

## 2021-08-04 DIAGNOSIS — G43109 Migraine with aura, not intractable, without status migrainosus: Secondary | ICD-10-CM | POA: Diagnosis not present

## 2021-08-04 DIAGNOSIS — Z3689 Encounter for other specified antenatal screening: Secondary | ICD-10-CM | POA: Insufficient documentation

## 2021-08-04 DIAGNOSIS — O113 Pre-existing hypertension with pre-eclampsia, third trimester: Secondary | ICD-10-CM | POA: Diagnosis not present

## 2021-08-04 DIAGNOSIS — Z7982 Long term (current) use of aspirin: Secondary | ICD-10-CM | POA: Diagnosis not present

## 2021-08-04 DIAGNOSIS — O10919 Unspecified pre-existing hypertension complicating pregnancy, unspecified trimester: Secondary | ICD-10-CM

## 2021-08-04 DIAGNOSIS — O99283 Endocrine, nutritional and metabolic diseases complicating pregnancy, third trimester: Secondary | ICD-10-CM | POA: Diagnosis not present

## 2021-08-04 DIAGNOSIS — Z3A31 31 weeks gestation of pregnancy: Secondary | ICD-10-CM | POA: Insufficient documentation

## 2021-08-04 DIAGNOSIS — Z8249 Family history of ischemic heart disease and other diseases of the circulatory system: Secondary | ICD-10-CM | POA: Diagnosis not present

## 2021-08-04 DIAGNOSIS — O10013 Pre-existing essential hypertension complicating pregnancy, third trimester: Secondary | ICD-10-CM | POA: Diagnosis not present

## 2021-08-04 DIAGNOSIS — E876 Hypokalemia: Secondary | ICD-10-CM | POA: Insufficient documentation

## 2021-08-04 DIAGNOSIS — O99353 Diseases of the nervous system complicating pregnancy, third trimester: Secondary | ICD-10-CM | POA: Insufficient documentation

## 2021-08-04 DIAGNOSIS — O26893 Other specified pregnancy related conditions, third trimester: Secondary | ICD-10-CM | POA: Insufficient documentation

## 2021-08-04 LAB — URINALYSIS, ROUTINE W REFLEX MICROSCOPIC
Bilirubin Urine: NEGATIVE
Glucose, UA: NEGATIVE mg/dL
Hgb urine dipstick: NEGATIVE
Ketones, ur: 5 mg/dL — AB
Leukocytes,Ua: NEGATIVE
Nitrite: NEGATIVE
Protein, ur: NEGATIVE mg/dL
Specific Gravity, Urine: 1.004 — ABNORMAL LOW (ref 1.005–1.030)
pH: 7 (ref 5.0–8.0)

## 2021-08-04 LAB — CBC WITH DIFFERENTIAL/PLATELET
Abs Immature Granulocytes: 0.07 10*3/uL (ref 0.00–0.07)
Basophils Absolute: 0 10*3/uL (ref 0.0–0.1)
Basophils Relative: 0 %
Eosinophils Absolute: 0 10*3/uL (ref 0.0–0.5)
Eosinophils Relative: 0 %
HCT: 33.7 % — ABNORMAL LOW (ref 36.0–46.0)
Hemoglobin: 11 g/dL — ABNORMAL LOW (ref 12.0–15.0)
Immature Granulocytes: 1 %
Lymphocytes Relative: 15 %
Lymphs Abs: 1.9 10*3/uL (ref 0.7–4.0)
MCH: 27.9 pg (ref 26.0–34.0)
MCHC: 32.6 g/dL (ref 30.0–36.0)
MCV: 85.5 fL (ref 80.0–100.0)
Monocytes Absolute: 0.8 10*3/uL (ref 0.1–1.0)
Monocytes Relative: 6 %
Neutro Abs: 10.2 10*3/uL — ABNORMAL HIGH (ref 1.7–7.7)
Neutrophils Relative %: 78 %
Platelets: 283 10*3/uL (ref 150–400)
RBC: 3.94 MIL/uL (ref 3.87–5.11)
RDW: 12.7 % (ref 11.5–15.5)
WBC: 13.1 10*3/uL — ABNORMAL HIGH (ref 4.0–10.5)
nRBC: 0 % (ref 0.0–0.2)

## 2021-08-04 LAB — COMPREHENSIVE METABOLIC PANEL
ALT: 20 U/L (ref 0–44)
AST: 16 U/L (ref 15–41)
Albumin: 2.9 g/dL — ABNORMAL LOW (ref 3.5–5.0)
Alkaline Phosphatase: 112 U/L (ref 38–126)
Anion gap: 10 (ref 5–15)
BUN: <5 mg/dL — ABNORMAL LOW (ref 6–20)
CO2: 22 mmol/L (ref 22–32)
Calcium: 8.7 mg/dL — ABNORMAL LOW (ref 8.9–10.3)
Chloride: 103 mmol/L (ref 98–111)
Creatinine, Ser: 0.52 mg/dL (ref 0.44–1.00)
GFR, Estimated: >60 mL/min (ref >60)
Glucose, Bld: 75 mg/dL (ref 70–99)
Potassium: 3.3 mmol/L — ABNORMAL LOW (ref 3.5–5.1)
Sodium: 135 mmol/L (ref 135–145)
Total Bilirubin: 0.3 mg/dL (ref 0.3–1.2)
Total Protein: 6.4 g/dL — ABNORMAL LOW (ref 6.5–8.1)

## 2021-08-04 LAB — PROTEIN / CREATININE RATIO, URINE
Creatinine, Urine: 58 mg/dL
Total Protein, Urine: <6 mg/dL

## 2021-08-04 MED ORDER — METOCLOPRAMIDE HCL 10 MG PO TABS: 10.0000 mg | ORAL_TABLET | Freq: Three times a day (TID) | ORAL | 0 refills | Status: DC

## 2021-08-04 MED ORDER — METOCLOPRAMIDE HCL 5 MG/ML IJ SOLN
10.0000 mg | Freq: Once | INTRAMUSCULAR | Status: AC
  Administered 2021-08-04: 10 mg via INTRAVENOUS
  Filled 2021-08-04: qty 2

## 2021-08-04 MED ORDER — DEXAMETHASONE SODIUM PHOSPHATE 10 MG/ML IJ SOLN
10.0000 mg | Freq: Once | INTRAMUSCULAR | Status: AC
  Administered 2021-08-04: 10 mg via INTRAVENOUS
  Filled 2021-08-04: qty 1

## 2021-08-04 MED ORDER — CYCLOBENZAPRINE HCL 10 MG PO TABS: 10.0000 mg | ORAL_TABLET | Freq: Three times a day (TID) | ORAL | 0 refills | Status: DC | PRN

## 2021-08-04 MED ORDER — DIPHENHYDRAMINE HCL 50 MG/ML IJ SOLN
12.5000 mg | Freq: Once | INTRAMUSCULAR | Status: AC
  Administered 2021-08-04: 12.5 mg via INTRAVENOUS
  Filled 2021-08-04: qty 1

## 2021-08-04 MED ORDER — LACTATED RINGERS IV BOLUS
1000.0000 mL | Freq: Once | INTRAVENOUS | Status: AC
  Administered 2021-08-04: 1000 mL via INTRAVENOUS

## 2021-08-04 NOTE — MAU Note (Signed)
Wasn't feeling well this morning, lighteheaded, and headache the past few days.  BP 170/82.  Dr had advised her to come in if BP elevated. Has not taken anything for the headache. Seeing floaters. Denies RUQ pain or increase in swelling. Was seen on Tuesday, HA " has been persistent since".

## 2021-08-04 NOTE — MAU Provider Note (Signed)
History     CSN: [id number]  Arrival date and time: 08/04/21 0941   Event Date/Time   First Provider Initiated Contact with Patient 08/04/21 1047      Chief Complaint  Patient presents with   Hypertension   Headache   Ms. Regina Mckee is a 25 y.o. G1P0 at [redacted]w[redacted]d who presents to MAU for preeclampsia evaluation after she had an elevated BP of 170/80s. Patient states she has also has a headache that has been on-going since Tuesday that she rates as 7/10. Patient states she has not taken anything for her headache. Patient also states she sees sparkles in her vision intermittently. Patient states she does have a history of migraine headaches and this feels like a typical migraine for her, with the sparkles also being typical. Patient states she has had an increase in HA frequency since she started her pregnancy. Patient also reports dizziness.  Patient also states she has nausea and vomiting, but reports this has been on-going her entire pregnancy, and she has not taken anything for this because she cannot keep down her nausea medication.  Pt denies blurry vision, epigastric pain, swelling in face and hands, sudden weight gain. Pt denies chest pain and SOB.  Pt denies constipation, diarrhea, or urinary problems. Pt denies fever, chills, fatigue, sweating or changes in appetite. Pt denies light-headedness, weakness.  Pt denies VB, ctx, LOF and reports good FM.  Current pregnancy problems? cHTN, obesity in pregnancy Blood Type? O Positive Allergies? cherries Current medications? PNV, not on low dose ASA Current PNC & next appt? [facility], 08/08/2021   OB History     Gravida  1   Para      Term      Preterm      AB      Living         SAB      IAB      Ectopic      Multiple      Live Births              Past Medical History:  Diagnosis Date   Asthma    Eczema    History of ADHD    Hypertension     Past Surgical History:  Procedure  Laterality Date   NO PAST SURGERIES      Family History  Problem Relation Age of Onset   Stroke Other    Hypertension Other    Cancer Other    Diabetes Other    Hypertension Father    Diabetes Father     Social History   Tobacco Use   Smoking status: Never   Smokeless tobacco: Never  Vaping Use   Vaping Use: Never used  Substance Use Topics   Alcohol use: Not Currently   Drug use: Not Currently    Types: Marijuana    Allergies:  Allergies  Allergen Reactions   Other Itching    Cherries cause throat to itch.    Medications Prior to Admission  Medication Sig Dispense Refill Last Dose   aspirin EC 81 MG tablet Take 1 tablet (81 mg total) by mouth daily. (Patient not taking: No sig reported) 60 tablet 1    Blood Pressure Monitor DEVI 1 Device by Does not apply route as needed. 1 each 0    Prenatal Vit-Fe Fumarate-FA (PREPLUS) 27-1 MG TABS Take 1 tablet by mouth daily. 30 tablet 13     Review of Systems  Constitutional:  Negative for chills,  diaphoresis, fatigue and fever.  Eyes:  Positive for visual disturbance.  Respiratory:  Negative for shortness of breath.   Cardiovascular:  Negative for chest pain.  Gastrointestinal:  Positive for nausea and vomiting. Negative for abdominal pain, constipation and diarrhea.  Genitourinary:  Negative for dysuria, flank pain, frequency, pelvic pain, urgency, vaginal bleeding and vaginal discharge.  Neurological:  Positive for dizziness and headaches. Negative for weakness and light-headedness.   Physical Exam   Blood pressure (!) 118/50, pulse 82, temperature 98.6 F (37 C), temperature source Oral, resp. rate 18, height 5\' 3"  (1.6 m), weight 80.9 kg, last menstrual period 12/24/2020, SpO2 96 %.  Patient Vitals for the past 24 hrs:  BP Temp Temp src Pulse Resp SpO2 Height Weight  08/04/21 1301 (!) 118/50 -- -- 82 -- 96 % -- --  08/04/21 1246 (!) 116/54 -- -- 80 -- -- -- --  08/04/21 1231 (!) 109/55 -- -- 72 -- 96 % -- --   08/04/21 1216 (!) 107/54 -- -- 79 -- 97 % -- --  08/04/21 1201 (!) 115/58 -- -- 69 -- 98 % -- --  08/04/21 1146 110/64 -- -- 78 -- 99 % -- --  08/04/21 1117 117/81 -- -- 81 -- -- -- --  08/04/21 1102 129/77 -- -- 83 -- -- -- --  08/04/21 1046 129/76 -- -- 82 -- 98 % -- --  08/04/21 1031 130/75 -- -- 74 -- 97 % -- --  08/04/21 1028 136/79 -- -- 86 -- -- -- --  08/04/21 1010 131/77 98.6 F (37 C) Oral 78 18 100 % 5\' 3"  (1.6 m) 80.9 kg   Physical Exam Vitals and nursing note reviewed.  Constitutional:      General: She is not in acute distress.    Appearance: Normal appearance. She is not ill-appearing, toxic-appearing or diaphoretic.  HENT:     Head: Normocephalic and atraumatic.  Pulmonary:     Effort: Pulmonary effort is normal.  Neurological:     Mental Status: She is alert and oriented to person, place, and time.  Psychiatric:        Mood and Affect: Mood normal.        Behavior: Behavior normal.        Thought Content: Thought content normal.        Judgment: Judgment normal.   Results for orders placed or performed during the hospital encounter of 08/04/21 (from the past 24 hour(s))  Urinalysis, Routine w reflex microscopic Urine, Clean Catch     Status: Abnormal   Collection Time: 08/04/21 10:15 AM  Result Value Ref Range   Color, Urine YELLOW YELLOW   APPearance HAZY (A) CLEAR   Specific Gravity, Urine 1.004 (L) 1.005 - 1.030   pH 7.0 5.0 - 8.0   Glucose, UA NEGATIVE NEGATIVE mg/dL   Hgb urine dipstick NEGATIVE NEGATIVE   Bilirubin Urine NEGATIVE NEGATIVE   Ketones, ur 5 (A) NEGATIVE mg/dL   Protein, ur NEGATIVE NEGATIVE mg/dL   Nitrite NEGATIVE NEGATIVE   Leukocytes,Ua NEGATIVE NEGATIVE  Protein / creatinine ratio, urine     Status: None   Collection Time: 08/04/21 10:54 AM  Result Value Ref Range   Creatinine, Urine 58.00 mg/dL   Total Protein, Urine <6 mg/dL   Protein Creatinine Ratio        0.00 - 0.15 mg/mg[Cre]  CBC with Differential/Platelet     Status:  Abnormal   Collection Time: 08/04/21 11:42 AM  Result Value Ref Range  WBC 13.1 (H) 4.0 - 10.5 K/uL   RBC 3.94 3.87 - 5.11 MIL/uL   Hemoglobin 11.0 (L) 12.0 - 15.0 g/dL   HCT 16.1 (L) 09.6 - 04.5 %   MCV 85.5 80.0 - 100.0 fL   MCH 27.9 26.0 - 34.0 pg   MCHC 32.6 30.0 - 36.0 g/dL   RDW 40.9 81.1 - 91.4 %   Platelets 283 150 - 400 K/uL   nRBC 0.0 0.0 - 0.2 %   Neutrophils Relative % 78 %   Neutro Abs 10.2 (H) 1.7 - 7.7 K/uL   Lymphocytes Relative 15 %   Lymphs Abs 1.9 0.7 - 4.0 K/uL   Monocytes Relative 6 %   Monocytes Absolute 0.8 0.1 - 1.0 K/uL   Eosinophils Relative 0 %   Eosinophils Absolute 0.0 0.0 - 0.5 K/uL   Basophils Relative 0 %   Basophils Absolute 0.0 0.0 - 0.1 K/uL   Immature Granulocytes 1 %   Abs Immature Granulocytes 0.07 0.00 - 0.07 K/uL  Comprehensive metabolic panel     Status: Abnormal   Collection Time: 08/04/21 11:42 AM  Result Value Ref Range   Sodium 135 135 - 145 mmol/L   Potassium 3.3 (L) 3.5 - 5.1 mmol/L   Chloride 103 98 - 111 mmol/L   CO2 22 22 - 32 mmol/L   Glucose, Bld 75 70 - 99 mg/dL   BUN <5 (L) 6 - 20 mg/dL   Creatinine, Ser 7.82 0.44 - 1.00 mg/dL   Calcium 8.7 (L) 8.9 - 10.3 mg/dL   Total Protein 6.4 (L) 6.5 - 8.1 g/dL   Albumin 2.9 (L) 3.5 - 5.0 g/dL   AST 16 15 - 41 U/L   ALT 20 0 - 44 U/L   Alkaline Phosphatase 112 38 - 126 U/L   Total Bilirubin 0.3 0.3 - 1.2 mg/dL   GFR, Estimated >95 >62 mL/min   Anion gap 10 5 - 15    [place] MFM OB FOLLOW UP  Result Date: 08/01/2021 ----------------------------------------------------------------------  OBSTETRICS REPORT                       (Signed Final 08/01/2021 10:40 am) ---------------------------------------------------------------------- Patient Info  ID #:       [id number]                          D.O.B.:  02-05-1996 (24 yrs)  Name:       Regina Loose T                     Visit Date: 08/01/2021 09:26 am              [facility]  ---------------------------------------------------------------------- Performed By  Attending:        Ma Rings MD         Ref. Address:     [address]  [place], [place]                                                             [address]  Performed By:     Reinaldo Raddle            [facility]  Referred By:      Minooka Bing MD ---------------------------------------------------------------------- Orders  #  Description                           Code        Ordered By  1  [place] MFM OB FOLLOW UP                   49826.41    Rosana Hoes ----------------------------------------------------------------------  #  Order #                     Accession #                Episode #  1  [id number]                   [id number]                 [id number] ---------------------------------------------------------------------- Indications  Hypertension - Chronic/Pre-existing(No         O10.019  Meds)  Antenatal follow-up for nonvisualized fetal    Z36.2  anatomy  Low Risk NIPS(Negative AFP)(Negative  [facility])  [redacted] weeks gestation of pregnancy                Z3A.31 ---------------------------------------------------------------------- Vital Signs                                                 Height:        5'3" ---------------------------------------------------------------------- Fetal Evaluation  Num Of Fetuses:         1  Fetal Heart Rate(bpm):  150  Cardiac Activity:       Observed  Presentation:           Cephalic  Placenta:               Anterior  P. Cord Insertion:      Visualized, central  Amniotic Fluid  AFI FV:      Within normal limits  AFI Sum(cm)      %Tile       Largest Pocket(cm)  9.27            9           6.11  RUQ(cm)       RLQ(cm)       LUQ(cm)        LLQ(cm)  6.11          1.64          0              1.53 ---------------------------------------------------------------------- Biometry  BPD:      79.6  mm     G. Age:  32w 0d         56  %    CI:        79.89   %    70 - 86                                                          FL/HC:      19.9   %    19.3 - 21.3  HC:      281.4  mm     G. Age:  30w 6d          7  %    HC/AC:      1.03        0.96 - 1.17  AC:      272.7  mm     G. Age:  31w 2d         45  %    FL/BPD:     70.5   %    71 - 87  FL:       56.1  mm     G. Age:  29w 4d        3.5  %    FL/AC:      20.6   %    20 - 24  HUM:      54.7  mm     G. Age:  31w 6d         59  %  LV:        2.5  mm  Est. FW:    1637  gm    3 lb 10 oz      20  % ---------------------------------------------------------------------- OB History  Gravidity:    1 ---------------------------------------------------------------------- Gestational Age  LMP:           31w 3d        Date:  12/24/20                 EDD:   09/30/21  U/S Today:     31w 0d  EDD:   10/03/21  Best:          31w 3d     Det. By:  LMP  (12/24/20)          EDD:   09/30/21 ---------------------------------------------------------------------- Anatomy  Cranium:               Appears normal         LVOT:                   Previously seen  Cavum:                 Appears normal         Aortic Arch:            Previously seen  Ventricles:            Appears normal         Ductal Arch:            Previously seen  Choroid Plexus:        Previously seen        Diaphragm:              Previously seen  Cerebellum:            Previously seen        Stomach:                Appears normal, left                                                                        sided  Posterior Fossa:       Previously seen        Abdomen:                Appears normal  Nuchal Fold:            Previously seen        Abdominal Wall:         Previously seen  Face:                  Orbits and profile     Cord Vessels:           Previously seen                         previously seen  Lips:                  Previously seen        Kidneys:                Appear normal  Palate:                Previously seen        Bladder:                Appears normal  Thoracic:              Appears normal         Spine:                  Previously seen  Heart:  Appears normal         Upper Extremities:      Previously seen                         (4CH, axis, and                         situs)  RVOT:                  Previously seen        Lower Extremities:      Previously seen  Other:  Female gender previously seen. Nasal bone prev visualized. Lenses          prev visualized. Heels/RIGHT foot and open hands/5th digits prev          visualized. VC, 3VV and 3VTV prev visualized. ---------------------------------------------------------------------- Cervix Uterus Adnexa  Cervix  Not visualized (advanced GA >24wks)  Uterus  No abnormality visualized.  Right Ovary  Not visualized.  Left Ovary  Not visualized.  Cul De Sac  No free fluid seen.  Adnexa  No adnexal mass visualized. ---------------------------------------------------------------------- Comments  This patient was seen for a follow up growth scan due to  chronic hypertension that is not currently treated with any  medications.  She denies any problems since her last exam.  She was informed that the fetal growth and amniotic fluid  level appears appropriate for her gestational age.  A follow up exam was scheduled in 5 weeks. ----------------------------------------------------------------------                   Ma RingsVictor Fang, MD Electronically Signed Final Report   08/01/2021 10:40 am ----------------------------------------------------------------------   MAU Course  Procedures  MDM -preeclampsia evaluation without elevated pressure in MAU on  admission or during stay -symptoms include: HA, sparkles in vision; HA cocktail given -UA: hazy/SG1.004/5ketones -CBC: H/H 11.0/33.7, platelets 283 -CMP: serum creatinine 0.52, AST/ALT 16/20 -PCr: below reportable range -EFM: reactive       -baseline: 140       -variability: moderate       -accels: present, 10x10       -decels: absent       -TOCO: irritability -after medication administration, pt reports HA now 0/10 and vision symptoms have resolved -pt discharged to home in stable condition  Orders Placed This Encounter  Procedures   Urinalysis, Routine w reflex microscopic Urine, Clean Catch    Standing Status:   Standing    Number of Occurrences:   1   CBC with Differential/Platelet    Standing Status:   Standing    Number of Occurrences:   1   Comprehensive metabolic panel    Standing Status:   Standing    Number of Occurrences:   1   Protein / creatinine ratio, urine    Standing Status:   Standing    Number of Occurrences:   1   Insert peripheral IV    Standing Status:   Standing    Number of Occurrences:   1   Discharge patient    Order Specific Question:   Discharge disposition    Answer:   01-Home or Self Care [1]    Order Specific Question:   Discharge patient date    Answer:   08/04/2021   Meds ordered this encounter  Medications   lactated ringers bolus 1,000 mL   diphenhydrAMINE (BENADRYL) injection 12.5 mg   dexamethasone (DECADRON)  injection 10 mg   metoCLOPramide (REGLAN) injection 10 mg   metoCLOPramide (REGLAN) 10 MG tablet    Sig: Take 1 tablet (10 mg total) by mouth 3 (three) times daily with meals.    Dispense:  90 tablet    Refill:  0    Order Specific Question:   Supervising Provider    Answer:   Jaynie Collins A [[id number]]   cyclobenzaprine (FLEXERIL) 10 MG tablet    Sig: Take 1 tablet (10 mg total) by mouth 3 (three) times daily as needed.    Dispense:  30 tablet    Refill:  0    Order Specific Question:   Supervising Provider    Answer:    Jaynie Collins A [[id number]]   Assessment and Plan   1. Migraine with aura and without status migrainosus, not intractable   2. [redacted] weeks gestation of pregnancy   3. NST (non-stress test) reactive   4. Chronic hypertension during pregnancy, antepartum   5. Hypokalemia    Allergies as of 08/04/2021       Reactions   Other Itching   Cherries cause throat to itch.        Medication List     STOP taking these medications    aspirin EC 81 MG tablet       TAKE these medications    Blood Pressure Monitor Devi 1 Device by Does not apply route as needed.   cyclobenzaprine 10 MG tablet Commonly known as: FLEXERIL Take 1 tablet (10 mg total) by mouth 3 (three) times daily as needed.   metoCLOPramide 10 MG tablet Commonly known as: REGLAN Take 1 tablet (10 mg total) by mouth 3 (three) times daily with meals.   PrePLUS 27-1 MG Tabs Take 1 tablet by mouth daily.       -pt to bring BP cuff to next visit to check for fit and function -increase potassium intake through diet For prevention of migraines in pregnancy: -Magnesium, 400mg  by mouth, once daily -Vitamin B2, 400mg  by mouth, once daily  For treatment of migraines in pregnancy: -take medication at the first sign of the pain of a headache, or the first sign of your aura -start with 1000mg  Tylenol (do not exceed 4000mg  of Tylenol in 24hrs), with or without Reglan 10mg  -if no relief after 1-2hours, can take Flexeril 10mg  -if the above regimen does not resolve your headache at all, please come to MAU for additional treatment  -return MAU precautions given -pt discharged to home in stable condition   E Amiayah Giebel 08/04/2021, 1:47 PM

## 2021-08-04 NOTE — Discharge Instructions (Signed)
For prevention of migraines in pregnancy: -Magnesium, 400mg by mouth, once daily -Vitamin B2, 400mg by mouth, once daily  For treatment of migraines in pregnancy: -take medication at the first sign of the pain of a headache, or the first sign of your aura -start with 1000mg Tylenol (do not exceed 4000mg of Tylenol in 24hrs), with or without Reglan 10mg -if no relief after 1-2hours, can take Flexeril 10mg -if the above regimen does not resolve your headache at all, please come to MAU for additional treatment      

## 2021-08-08 ENCOUNTER — Ambulatory Visit (INDEPENDENT_AMBULATORY_CARE_PROVIDER_SITE_OTHER): Payer: Medicaid Other

## 2021-08-08 ENCOUNTER — Other Ambulatory Visit: Payer: Self-pay

## 2021-08-08 ENCOUNTER — Ambulatory Visit (INDEPENDENT_AMBULATORY_CARE_PROVIDER_SITE_OTHER): Payer: Medicaid Other | Admitting: Family Medicine

## 2021-08-08 VITALS — BP 140/90 | HR 83 | Wt 183.2 lb

## 2021-08-08 DIAGNOSIS — O10919 Unspecified pre-existing hypertension complicating pregnancy, unspecified trimester: Secondary | ICD-10-CM

## 2021-08-08 DIAGNOSIS — Z3A32 32 weeks gestation of pregnancy: Secondary | ICD-10-CM

## 2021-08-08 DIAGNOSIS — O0990 Supervision of high risk pregnancy, unspecified, unspecified trimester: Secondary | ICD-10-CM

## 2021-08-08 LAB — POCT URINALYSIS DIP (DEVICE)
Bilirubin Urine: NEGATIVE
Glucose, UA: NEGATIVE mg/dL
Hgb urine dipstick: NEGATIVE
Ketones, ur: NEGATIVE mg/dL
Leukocytes,Ua: NEGATIVE
Nitrite: NEGATIVE
Protein, ur: NEGATIVE mg/dL
Specific Gravity, Urine: 1.015 (ref 1.005–1.030)
Urobilinogen, UA: 0.2 mg/dL (ref 0.0–1.0)
pH: 7.5 (ref 5.0–8.0)

## 2021-08-08 MED ORDER — NIFEDIPINE ER OSMOTIC RELEASE 30 MG PO TB24: 30.0000 mg | ORAL_TABLET | Freq: Every day | ORAL | 0 refills | Status: DC

## 2021-08-08 NOTE — Progress Notes (Signed)
Subjective:  Regina Mckee is a 25 y.o. G1P0 at [redacted]w[redacted]d being seen today for ongoing prenatal care.  She is currently monitored for the following issues for this high-risk pregnancy and has Chronic hypertension during pregnancy, antepartum; Supervision of high risk pregnancy, antepartum; BMI 30s; and Obesity in pregnancy on their problem list.  Patient reports  intermittent headaches that improve with Tylenol and Flexeril .  Contractions: Irritability. Vag. Bleeding: None.  Movement: Present. Denies leaking of fluid.   The following portions of the patient's history were reviewed and updated as appropriate: allergies, current medications, past family history, past medical history, past social history, past surgical history and problem list. Problem list updated.  Objective:   Vitals:   08/08/21 0911  BP: 140/90  Pulse: 83  Weight: 183 lb 3.2 oz (83.1 kg)    Fetal Status: Fetal Heart Rate (bpm): 140 Fundal Height: 33 cm Movement: Present     General:  Alert, oriented and cooperative. Patient is in no acute distress.  Skin: Skin is warm and dry. No rash noted.   Cardiovascular: Normal heart rate noted  Respiratory: Normal respiratory effort, no problems with respiration noted  Abdomen: Soft, gravid, appropriate for gestational age. Pain/Pressure: Absent     Pelvic: Vag. Bleeding: None     Cervical exam deferred        Extremities: Normal range of motion.  Edema: None  Mental Status: Normal mood and affect. Normal behavior. Normal judgment and thought content.     Assessment and Plan:  Pregnancy: G1P0 at [redacted]w[redacted]d  1. Supervision of high risk pregnancy, antepartum 2. [redacted] weeks gestation of pregnancy Progressing well. List of pediatricians provided. Would like depo PP for birth control.  2. Chronic hypertension during pregnancy, antepartum - Hx of chronic hypertension not on meds, BP today 140/90, on recheck 142/92. Went to MAU on 8/12 and had normal LFTS, plt, and p:c  normal. Previously prescribed ASA but never started taking. - Given elevated BP will start medication (Nifedipine 30 XL) - Given now on medication and at 32 weeks per antenatal testing guidelines will start weekly BPP this week (scheduled for today) - Already has follow up growth [place] scheduled    Preterm labor symptoms and general obstetric precautions including but not limited to vaginal bleeding, contractions, leaking of fluid and fetal movement were reviewed in detail with the patient. Please refer to After Visit Summary for other counseling recommendations.  Return in about 2 weeks (around 08/22/2021) for HROB, with MD/DO.   Warner Mccreedy, MD, MPH OB Fellow, Faculty Practice

## 2021-08-08 NOTE — Patient Instructions (Signed)
AREA PEDIATRIC/FAMILY PRACTICE PHYSICIANS  [facility] [address] [place], [place] [address] Phone - [phone]   Fax - [phone]  [facility] [address] [place], [place]  [address] Phone - [phone]   Fax - [phone]  [facility] [address] [place], [place]  [address] Phone - [phone]   Fax - [phone]   [facility] [address] [place], [place]  [address] Phone - [phone]   Fax - [phone]  [facility] [address], [address] [place], [place]  [address] Phone - [phone]   Fax - [phone]  [facility] [address], [address] [place], [place]  [address] Phone - [phone]   Fax - [phone]  [facility] [address] [place], [place]  [address] Phone - [phone]   Fax - [phone]  [facility] [address], [address] [place], [place]  [address] Phone - [phone]   Fax - [phone]  [facility] [address] [place], [place]  [address] Phone - [phone]   Fax - [phone] [facility] [address] [place], [place]  [address] Phone - [phone]   Fax - [phone]  [facility] [address] [place], [place]  [address] Phone - [phone]   Fax - [phone]  [facility] [address] [place], [place]  [address] Phone - [phone]   Fax - [phone]  [facility] [address], [address] [place], [place]  [address] Phone - [phone]   Fax - [phone]  [facility] [address] [place], [place]  [address] Phone - [phone]  [facility] [address] [place], [place]  [address] Phone - [phone]   Fax - [phone]  [facility] [address] [place], [place]  [address] Phone - [phone]   Fax - [phone]  [facility] [address] [place], [place]  [address] Phone - [phone]   Fax - [phone]  [facility] [address] [place], [place]  [address] Phone - [phone]   Fax - [phone]   [facility] [address] [place], [place]  [address] Phone - [phone]   Fax - [phone]  [facility] [address] [place], [place]  [address] Phone - [phone]   Fax - [phone]  [facility] [address] [place], [place]  [address] Phone - [phone]   Fax - [phone]  [facility] [address] , [place]  [address] Phone - [phone]   Fax - [phone]  [facility] [address] , [place]  [address] Phone - [phone]   Fax - [phone] [facility] [address] [place], [place]  [address] Phone - [phone]   Fax - [phone]  [facility] [address] [place],   [address] Phone - [phone]   Fax - [phone]  [facility] [address] ,   [address] Phone - [phone]   Fax - [phone]   

## 2021-08-17 ENCOUNTER — Ambulatory Visit (INDEPENDENT_AMBULATORY_CARE_PROVIDER_SITE_OTHER): Payer: Medicaid Other

## 2021-08-17 ENCOUNTER — Other Ambulatory Visit: Payer: Self-pay

## 2021-08-17 ENCOUNTER — Encounter: Payer: Self-pay | Admitting: Family Medicine

## 2021-08-17 ENCOUNTER — Ambulatory Visit: Payer: Medicaid Other | Admitting: *Deleted

## 2021-08-17 VITALS — BP 137/86 | HR 95 | Wt 182.0 lb

## 2021-08-17 DIAGNOSIS — O10919 Unspecified pre-existing hypertension complicating pregnancy, unspecified trimester: Secondary | ICD-10-CM

## 2021-08-17 MED ORDER — PROMETHAZINE HCL 25 MG PO TABS: 25.0000 mg | ORAL_TABLET | Freq: Four times a day (QID) | ORAL | 1 refills | Status: DC | PRN

## 2021-08-17 MED ORDER — ONDANSETRON 8 MG PO TBDP: 8.0000 mg | ORAL_TABLET | Freq: Three times a day (TID) | ORAL | 0 refills | Status: DC | PRN

## 2021-08-17 NOTE — Progress Notes (Signed)
Pt informed that the ultrasound is considered a limited OB ultrasound and is not intended to be a complete ultrasound exam.  Patient also informed that the ultrasound is not being completed with the intent of assessing for fetal or placental anomalies or any pelvic abnormalities.  Explained that the purpose of today's ultrasound is to assess for presentation, BPP and amniotic fluid volume.  Patient acknowledges the purpose of the exam and the limitations of the study.    Pt c/o nausea, vomiting and diarrhea since 2:00 am this morning. She further stated that she has not been able to keep anything down including water. Pt had several episodes of bile emesis and mucous during visit. Pt was advised to take a Covid test today. If positive, pt should quarantine x5 days, then re-test. Consult w/Dr. Jolayne Panther and pt was provided with Rx for Phenergan and Zofran. Pt was advised on proper diet until she is feeling better.  She voiced understanding of all information and instructions given.

## 2021-08-21 ENCOUNTER — Other Ambulatory Visit: Payer: Self-pay

## 2021-08-21 ENCOUNTER — Inpatient Hospital Stay (HOSPITAL_COMMUNITY)
Admission: AD | Admit: 2021-08-21 | Discharge: 2021-08-21 | Disposition: A | Discharge status: Home / Self Care | Payer: Medicaid Other | Attending: Family Medicine | Admitting: Family Medicine

## 2021-08-21 ENCOUNTER — Encounter (HOSPITAL_COMMUNITY): Payer: Self-pay | Admitting: Family Medicine

## 2021-08-21 DIAGNOSIS — Z3A34 34 weeks gestation of pregnancy: Secondary | ICD-10-CM | POA: Insufficient documentation

## 2021-08-21 DIAGNOSIS — O163 Unspecified maternal hypertension, third trimester: Secondary | ICD-10-CM | POA: Diagnosis not present

## 2021-08-21 DIAGNOSIS — R1011 Right upper quadrant pain: Secondary | ICD-10-CM | POA: Diagnosis not present

## 2021-08-21 DIAGNOSIS — Z79899 Other long term (current) drug therapy: Secondary | ICD-10-CM | POA: Insufficient documentation

## 2021-08-21 DIAGNOSIS — O26893 Other specified pregnancy related conditions, third trimester: Secondary | ICD-10-CM | POA: Diagnosis not present

## 2021-08-21 DIAGNOSIS — Z8249 Family history of ischemic heart disease and other diseases of the circulatory system: Secondary | ICD-10-CM | POA: Diagnosis not present

## 2021-08-21 LAB — URINALYSIS, ROUTINE W REFLEX MICROSCOPIC
Bilirubin Urine: NEGATIVE
Glucose, UA: NEGATIVE mg/dL
Hgb urine dipstick: NEGATIVE
Ketones, ur: NEGATIVE mg/dL
Leukocytes,Ua: NEGATIVE
Nitrite: NEGATIVE
Protein, ur: NEGATIVE mg/dL
Specific Gravity, Urine: 1.004 — ABNORMAL LOW (ref 1.005–1.030)
pH: 7 (ref 5.0–8.0)

## 2021-08-21 LAB — COMPREHENSIVE METABOLIC PANEL
ALT: 13 U/L (ref 0–44)
AST: 13 U/L — ABNORMAL LOW (ref 15–41)
Albumin: 2.8 g/dL — ABNORMAL LOW (ref 3.5–5.0)
Alkaline Phosphatase: 117 U/L (ref 38–126)
Anion gap: 8 (ref 5–15)
BUN: <5 mg/dL — ABNORMAL LOW (ref 6–20)
CO2: 23 mmol/L (ref 22–32)
Calcium: 9.1 mg/dL (ref 8.9–10.3)
Chloride: 104 mmol/L (ref 98–111)
Creatinine, Ser: 0.51 mg/dL (ref 0.44–1.00)
GFR, Estimated: >60 mL/min (ref >60)
Glucose, Bld: 84 mg/dL (ref 70–99)
Potassium: 3.6 mmol/L (ref 3.5–5.1)
Sodium: 135 mmol/L (ref 135–145)
Total Bilirubin: 0.5 mg/dL (ref 0.3–1.2)
Total Protein: 6.5 g/dL (ref 6.5–8.1)

## 2021-08-21 LAB — CBC
HCT: 33.3 % — ABNORMAL LOW (ref 36.0–46.0)
Hemoglobin: 10.9 g/dL — ABNORMAL LOW (ref 12.0–15.0)
MCH: 27.7 pg (ref 26.0–34.0)
MCHC: 32.7 g/dL (ref 30.0–36.0)
MCV: 84.5 fL (ref 80.0–100.0)
Platelets: 279 10*3/uL (ref 150–400)
RBC: 3.94 MIL/uL (ref 3.87–5.11)
RDW: 12.8 % (ref 11.5–15.5)
WBC: 15.4 10*3/uL — ABNORMAL HIGH (ref 4.0–10.5)
nRBC: 0 % (ref 0.0–0.2)

## 2021-08-21 LAB — PROTEIN / CREATININE RATIO, URINE
Creatinine, Urine: 38.76 mg/dL
Total Protein, Urine: <6 mg/dL

## 2021-08-21 MED ORDER — LACTATED RINGERS IV BOLUS
1000.0000 mL | Freq: Once | INTRAVENOUS | Status: AC
  Administered 2021-08-21: 1000 mL via INTRAVENOUS

## 2021-08-21 MED ORDER — NIFEDIPINE 10 MG PO CAPS
10.0000 mg | ORAL_CAPSULE | ORAL | Status: AC | PRN
  Administered 2021-08-21 (×3): 10 mg via ORAL
  Filled 2021-08-21 (×3): qty 1

## 2021-08-21 MED ORDER — ONDANSETRON 4 MG PO TBDP
8.0000 mg | ORAL_TABLET | Freq: Once | ORAL | Status: AC
  Administered 2021-08-21: 8 mg via ORAL
  Filled 2021-08-21: qty 2

## 2021-08-21 NOTE — MAU Provider Note (Signed)
Patient Regina Mckee is a 25 y.o. G1P0 At [redacted]w[redacted]d here with complaints of RUQ pain for the past two days. She also endorses some contractions.  She is a CHTN on nifedipine 30 XL.  She also reports ongoing nausea and vomiting; she does not report an increase in NV. She reports some upper back pain before that she tried to resolve by arching her back. She denies fever, SOB, diarrhea, HA, blurry vision, floating spots.   History     CSN: [id number]  Arrival date and time: 08/21/21 1143   Event Date/Time   First Provider Initiated Contact with Patient 08/21/21 1220      Chief Complaint  Patient presents with   Abdominal Pain   Abdominal Pain This is a new problem. The current episode started yesterday. The pain is located in the RUQ. The pain is at a severity of 8/10. The quality of the pain is sharp and tearing. The abdominal pain does not radiate. Associated symptoms include constipation, nausea and vomiting. Pertinent negatives include no diarrhea or dysuria. Exacerbated by: "trying to lay down" Relieved by: squatting but not really.   OB History     Gravida  1   Para      Term      Preterm      AB      Living         SAB      IAB      Ectopic      Multiple      Live Births              Past Medical History:  Diagnosis Date   Asthma    Eczema    History of ADHD    Hypertension     Past Surgical History:  Procedure Laterality Date   NO PAST SURGERIES      Family History  Problem Relation Age of Onset   Stroke Other    Hypertension Other    Cancer Other    Diabetes Other    Hypertension Father    Diabetes Father     Social History   Tobacco Use   Smoking status: Never   Smokeless tobacco: Never  Vaping Use   Vaping Use: Never used  Substance Use Topics   Alcohol use: Not Currently   Drug use: Not Currently    Types: Marijuana    Allergies:  Allergies  Allergen Reactions   Other Itching    Cherries cause throat to  itch.    Medications Prior to Admission  Medication Sig Dispense Refill Last Dose   cyclobenzaprine (FLEXERIL) 10 MG tablet Take 1 tablet (10 mg total) by mouth 3 (three) times daily as needed. 30 tablet 0 Past Month   metoCLOPramide (REGLAN) 10 MG tablet Take 1 tablet (10 mg total) by mouth 3 (three) times daily with meals. 90 tablet 0 Past Week   NIFEdipine (PROCARDIA-XL/NIFEDICAL-XL) 30 MG 24 hr tablet Take 1 tablet (30 mg total) by mouth daily. 60 tablet 0 08/21/2021   Prenatal Vit-Fe Fumarate-FA (PREPLUS) 27-1 MG TABS Take 1 tablet by mouth daily. 30 tablet 13 08/21/2021   promethazine (PHENERGAN) 25 MG tablet Take 1 tablet (25 mg total) by mouth every 6 (six) hours as needed for nausea or vomiting. 30 tablet 1 Past Month   Blood Pressure Monitor DEVI 1 Device by Does not apply route as needed. 1 each 0    ondansetron (ZOFRAN-ODT) 8 MG disintegrating tablet Take 1 tablet (8 mg  total) by mouth every 8 (eight) hours as needed for nausea or vomiting. 10 tablet 0     Review of Systems  Gastrointestinal:  Positive for abdominal pain, constipation, nausea and vomiting. Negative for diarrhea.  Genitourinary:  Negative for dysuria.  Physical Exam   Blood pressure 131/88, pulse (!) 101, temperature 98.2 F (36.8 C), temperature source Oral, resp. rate 16, height 5\' 3"  (1.6 m), weight 83.2 kg, last menstrual period 12/24/2020, SpO2 100 %.  Physical Exam HENT:     Head: Normocephalic.  Abdominal:     General: There is no distension.     Palpations: Abdomen is soft.     Tenderness: There is abdominal tenderness in the right upper quadrant.  Genitourinary:    Vagina: Normal.  Neurological:     Mental Status: She is alert.  Cervix is FT, long, middle position. Upon recheck, she is still closed.   MAU Course  Procedures  MDM Patient Vitals for the past 24 hrs:  BP Temp Temp src Pulse Resp SpO2 Height Weight  08/21/21 1352 129/66 -- -- -- -- -- -- --  08/21/21 1327 126/65 -- -- -- -- --  -- --  08/21/21 1259 (!) 130/58 -- -- -- -- -- -- --  08/21/21 1212 131/88 -- -- (!) 101 -- -- -- --  08/21/21 1153 136/88 98.2 F (36.8 C) Oral (!) 104 16 100 % -- --  08/21/21 1151 -- -- -- -- -- -- 5\' 3"  (1.6 m) 83.2 kg   -Patient received IV hydration and 3 doses of procardia.   -Patient has had normal BPs while in MAU. SHe denies HA, blurry vision, floating spots. Her RUQ pain has resolved; she denies any contractions.   -LFTs and CBC are normal; PCR is normal. Patient does not meet criteria for SIPE  -NST: 150 bpm, mod var, present acel, no decels, uterine irratability Assessment and Plan   1. RUQ pain   -patient stable for discharge with strict pre-e precautions reviewed; patient verbalized understanding -patient to keep FUP on 08/24/2021 with [facility] -continue to take HTN medicine as prescribed -return to MAU if any visual changes, severe range blood pressures or other concerning symptoms  10/24/2021 Mazzie Brodrick 08/21/2021, 12:20 PM

## 2021-08-21 NOTE — MAU Note (Signed)
Regina Mckee is a 25 y.o. at [redacted]w[redacted]d here in MAU reporting: constant sharp right upper abdominal pain since yesterday. Unsure if it a contraction. No vaginal bleeding or LOF. +FM.  Onset of complaint: yesterday  Pain score: 8/10  Vitals:   08/21/21 1153  BP: 136/88  Pulse: (!) 104  Resp: 16  Temp: 98.2 F (36.8 C)  SpO2: 100%     FHT:151  Lab orders placed from triage: UA

## 2021-08-24 ENCOUNTER — Ambulatory Visit (INDEPENDENT_AMBULATORY_CARE_PROVIDER_SITE_OTHER): Payer: Medicaid Other | Admitting: *Deleted

## 2021-08-24 ENCOUNTER — Ambulatory Visit (INDEPENDENT_AMBULATORY_CARE_PROVIDER_SITE_OTHER): Payer: Medicaid Other | Admitting: Obstetrics & Gynecology

## 2021-08-24 ENCOUNTER — Encounter: Payer: Self-pay | Admitting: Family Medicine

## 2021-08-24 ENCOUNTER — Other Ambulatory Visit: Payer: Self-pay

## 2021-08-24 ENCOUNTER — Ambulatory Visit (INDEPENDENT_AMBULATORY_CARE_PROVIDER_SITE_OTHER): Payer: Medicaid Other

## 2021-08-24 VITALS — BP 140/84 | HR 104 | Wt 184.3 lb

## 2021-08-24 DIAGNOSIS — O10919 Unspecified pre-existing hypertension complicating pregnancy, unspecified trimester: Secondary | ICD-10-CM

## 2021-08-24 DIAGNOSIS — O0990 Supervision of high risk pregnancy, unspecified, unspecified trimester: Secondary | ICD-10-CM

## 2021-08-24 DIAGNOSIS — Z3A34 34 weeks gestation of pregnancy: Secondary | ICD-10-CM

## 2021-08-24 NOTE — Patient Instructions (Signed)
Return to office for any scheduled appointments. Call the office or go to the MAU at [facility] if:  You begin to have strong, frequent contractions  Your water breaks.  Sometimes it is a big gush of fluid, sometimes it is just a trickle that keeps getting your panties wet or running down your legs  You have vaginal bleeding.  It is normal to have a small amount of spotting if your cervix was checked.   You do not feel your baby moving like normal.  If you do not, get something to eat and drink and lay down and focus on feeling your baby move.   If your baby is still not moving like normal, you should call the office or go to MAU.  Any other obstetric concerns.   

## 2021-08-24 NOTE — Progress Notes (Signed)
   PRENATAL VISIT NOTE  Subjective:  Regina Mckee is a 25 y.o. G1P0 at [redacted]w[redacted]d being seen today for ongoing prenatal care.  She is currently monitored for the following issues for this high-risk pregnancy and has Chronic hypertension during pregnancy, antepartum; Supervision of high risk pregnancy, antepartum; BMI 30s; and Obesity in pregnancy on their problem list.  Patient reports no complaints.  Contractions: Irregular. Vag. Bleeding: None.  Movement: Present. Denies leaking of fluid.   The following portions of the patient's history were reviewed and updated as appropriate: allergies, current medications, past family history, past medical history, past social history, past surgical history and problem list.   Objective:   Vitals:   08/24/21 0858  BP: 140/84  Pulse: (!) 104  Weight: 184 lb 4.8 oz (83.6 kg)    Fetal Status:     Movement: Present     General:  Alert, oriented and cooperative. Patient is in no acute distress.  Skin: Skin is warm and dry. No rash noted.   Cardiovascular: Normal heart rate noted  Respiratory: Normal respiratory effort, no problems with respiration noted  Abdomen: Soft, gravid, appropriate for gestational age.  Pain/Pressure: Present     Pelvic: Cervical exam deferred        Extremities: Normal range of motion.  Edema: None  Mental Status: Normal mood and affect. Normal behavior. Normal judgment and thought content.   Assessment and Plan:  Pregnancy: G1P0 at [redacted]w[redacted]d 1. Chronic hypertension during pregnancy, antepartum Stable BP on Nifedipine. NST performed today was reviewed and was found to be reactive. Subsequent BPP performed today was also reviewed and was found to be 10/10. AFI was also normal. Continue recommended antenatal testing scans. PEC  precautions reviewed.  2. [redacted] weeks gestation of pregnancy 3. Supervision of high risk pregnancy, antepartum Preterm labor symptoms and general obstetric precautions including but not limited  to vaginal bleeding, contractions, leaking of fluid and fetal movement were reviewed in detail with the patient. Please refer to After Visit Summary for other counseling recommendations.   Return in about 12 days (around 09/05/2021) for [facility], Has [place] @ 0830; 8/22 needs HOB and NST/BPP.  Future Appointments  Date Time Provider Department Center  08/31/2021  8:15 AM [facility] NST [facility]  09/05/2021  8:30 AM [facility] NURSE [facility]  09/05/2021  8:45 AM [facility] US4 [facility]    Jaynie Collins, MD

## 2021-08-30 NOTE — Progress Notes (Signed)
Pt informed that the ultrasound is considered a limited OB ultrasound and is not intended to be a complete ultrasound exam.  Patient also informed that the ultrasound is not being completed with the intent of assessing for fetal or placental anomalies or any pelvic abnormalities.  Explained that the purpose of today's ultrasound is to assess for  BPP, presentation, and AFI.  Patient acknowledges the purpose of the exam and the limitations of the study.     Regina Mckee H RN BSN 08/31/21  

## 2021-08-31 ENCOUNTER — Ambulatory Visit (INDEPENDENT_AMBULATORY_CARE_PROVIDER_SITE_OTHER): Payer: Medicaid Other

## 2021-08-31 ENCOUNTER — Other Ambulatory Visit: Payer: Self-pay

## 2021-08-31 ENCOUNTER — Ambulatory Visit (INDEPENDENT_AMBULATORY_CARE_PROVIDER_SITE_OTHER): Payer: Medicaid Other | Admitting: General Practice

## 2021-08-31 ENCOUNTER — Encounter: Payer: Self-pay | Admitting: Family Medicine

## 2021-08-31 VITALS — BP 131/81 | HR 84 | Wt 186.0 lb

## 2021-08-31 DIAGNOSIS — O10919 Unspecified pre-existing hypertension complicating pregnancy, unspecified trimester: Secondary | ICD-10-CM

## 2021-09-05 ENCOUNTER — Encounter: Payer: Self-pay | Admitting: *Deleted

## 2021-09-05 ENCOUNTER — Other Ambulatory Visit: Payer: Self-pay

## 2021-09-05 ENCOUNTER — Ambulatory Visit: Payer: Medicaid Other | Admitting: *Deleted

## 2021-09-05 ENCOUNTER — Ambulatory Visit (HOSPITAL_BASED_OUTPATIENT_CLINIC_OR_DEPARTMENT_OTHER): Payer: Medicaid Other

## 2021-09-05 VITALS — BP 130/90 | HR 75

## 2021-09-05 DIAGNOSIS — O10013 Pre-existing essential hypertension complicating pregnancy, third trimester: Secondary | ICD-10-CM

## 2021-09-05 DIAGNOSIS — Z3A36 36 weeks gestation of pregnancy: Secondary | ICD-10-CM | POA: Diagnosis not present

## 2021-09-05 DIAGNOSIS — O10919 Unspecified pre-existing hypertension complicating pregnancy, unspecified trimester: Secondary | ICD-10-CM | POA: Insufficient documentation

## 2021-09-05 DIAGNOSIS — O10913 Unspecified pre-existing hypertension complicating pregnancy, third trimester: Secondary | ICD-10-CM | POA: Insufficient documentation

## 2021-09-06 ENCOUNTER — Other Ambulatory Visit: Payer: Self-pay

## 2021-09-06 ENCOUNTER — Encounter (HOSPITAL_COMMUNITY): Payer: Self-pay | Admitting: Obstetrics and Gynecology

## 2021-09-06 ENCOUNTER — Inpatient Hospital Stay (HOSPITAL_COMMUNITY)
Admission: AD | Admit: 2021-09-06 | Discharge: 2021-09-09 | DRG: 806 | Disposition: A | Discharge status: Home / Self Care | Payer: Medicaid Other | Attending: Obstetrics & Gynecology | Admitting: Obstetrics & Gynecology

## 2021-09-06 DIAGNOSIS — O99214 Obesity complicating childbirth: Secondary | ICD-10-CM | POA: Diagnosis present

## 2021-09-06 DIAGNOSIS — O1002 Pre-existing essential hypertension complicating childbirth: Secondary | ICD-10-CM | POA: Diagnosis present

## 2021-09-06 DIAGNOSIS — D62 Acute posthemorrhagic anemia: Secondary | ICD-10-CM | POA: Diagnosis not present

## 2021-09-06 DIAGNOSIS — O164 Unspecified maternal hypertension, complicating childbirth: Secondary | ICD-10-CM | POA: Diagnosis not present

## 2021-09-06 DIAGNOSIS — O42913 Preterm premature rupture of membranes, unspecified as to length of time between rupture and onset of labor, third trimester: Secondary | ICD-10-CM | POA: Diagnosis present

## 2021-09-06 DIAGNOSIS — O99824 Streptococcus B carrier state complicating childbirth: Secondary | ICD-10-CM | POA: Diagnosis present

## 2021-09-06 DIAGNOSIS — Z20822 Contact with and (suspected) exposure to covid-19: Secondary | ICD-10-CM | POA: Diagnosis present

## 2021-09-06 DIAGNOSIS — Z23 Encounter for immunization: Secondary | ICD-10-CM | POA: Diagnosis not present

## 2021-09-06 DIAGNOSIS — O1092 Unspecified pre-existing hypertension complicating childbirth: Secondary | ICD-10-CM | POA: Diagnosis not present

## 2021-09-06 DIAGNOSIS — O42919 Preterm premature rupture of membranes, unspecified as to length of time between rupture and onset of labor, unspecified trimester: Secondary | ICD-10-CM | POA: Diagnosis present

## 2021-09-06 DIAGNOSIS — O42113 Preterm premature rupture of membranes, onset of labor more than 24 hours following rupture, third trimester: Secondary | ICD-10-CM | POA: Diagnosis not present

## 2021-09-06 DIAGNOSIS — O9902 Anemia complicating childbirth: Secondary | ICD-10-CM | POA: Diagnosis not present

## 2021-09-06 DIAGNOSIS — Z3A36 36 weeks gestation of pregnancy: Secondary | ICD-10-CM

## 2021-09-06 DIAGNOSIS — O0990 Supervision of high risk pregnancy, unspecified, unspecified trimester: Secondary | ICD-10-CM

## 2021-09-06 DIAGNOSIS — Z8759 Personal history of other complications of pregnancy, childbirth and the puerperium: Secondary | ICD-10-CM | POA: Diagnosis present

## 2021-09-06 DIAGNOSIS — O10919 Unspecified pre-existing hypertension complicating pregnancy, unspecified trimester: Secondary | ICD-10-CM | POA: Diagnosis present

## 2021-09-06 LAB — TYPE AND SCREEN
ABO/RH(D): O POS
Antibody Screen: NEGATIVE

## 2021-09-06 LAB — CBC
HCT: 33.9 % — ABNORMAL LOW (ref 36.0–46.0)
Hemoglobin: 10.9 g/dL — ABNORMAL LOW (ref 12.0–15.0)
MCH: 27.1 pg (ref 26.0–34.0)
MCHC: 32.2 g/dL (ref 30.0–36.0)
MCV: 84.3 fL (ref 80.0–100.0)
Platelets: 299 10*3/uL (ref 150–400)
RBC: 4.02 MIL/uL (ref 3.87–5.11)
RDW: 13.2 % (ref 11.5–15.5)
WBC: 13.7 10*3/uL — ABNORMAL HIGH (ref 4.0–10.5)
nRBC: 0 % (ref 0.0–0.2)

## 2021-09-06 LAB — COMPREHENSIVE METABOLIC PANEL
ALT: 13 U/L (ref 0–44)
AST: 13 U/L — ABNORMAL LOW (ref 15–41)
Albumin: 2.6 g/dL — ABNORMAL LOW (ref 3.5–5.0)
Alkaline Phosphatase: 146 U/L — ABNORMAL HIGH (ref 38–126)
Anion gap: 7 (ref 5–15)
BUN: <5 mg/dL — ABNORMAL LOW (ref 6–20)
CO2: 23 mmol/L (ref 22–32)
Calcium: 8.6 mg/dL — ABNORMAL LOW (ref 8.9–10.3)
Chloride: 107 mmol/L (ref 98–111)
Creatinine, Ser: 0.65 mg/dL (ref 0.44–1.00)
GFR, Estimated: >60 mL/min (ref >60)
Glucose, Bld: 69 mg/dL — ABNORMAL LOW (ref 70–99)
Potassium: 3.3 mmol/L — ABNORMAL LOW (ref 3.5–5.1)
Sodium: 137 mmol/L (ref 135–145)
Total Bilirubin: 0.3 mg/dL (ref 0.3–1.2)
Total Protein: 6.2 g/dL — ABNORMAL LOW (ref 6.5–8.1)

## 2021-09-06 LAB — RESP PANEL BY RT-PCR (FLU A&B, COVID) ARPGX2
Influenza A by PCR: NEGATIVE
Influenza B by PCR: NEGATIVE
SARS Coronavirus 2 by RT PCR: NEGATIVE

## 2021-09-06 LAB — PROTEIN / CREATININE RATIO, URINE
Creatinine, Urine: 27.3 mg/dL
Total Protein, Urine: <6 mg/dL

## 2021-09-06 LAB — POCT FERN TEST: POCT Fern Test: POSITIVE

## 2021-09-06 MED ORDER — OXYCODONE-ACETAMINOPHEN 5-325 MG PO TABS: 1.0000 | ORAL_TABLET | ORAL | Status: DC | PRN

## 2021-09-06 MED ORDER — OXYTOCIN BOLUS FROM INFUSION: 333.0000 mL | Freq: Once | INTRAVENOUS | Status: DC

## 2021-09-06 MED ORDER — SODIUM CHLORIDE 0.9 % IV SOLN
5.0000 10*6.[IU] | Freq: Once | INTRAVENOUS | Status: AC
  Administered 2021-09-06: 5 10*6.[IU] via INTRAVENOUS
  Filled 2021-09-06: qty 5

## 2021-09-06 MED ORDER — ONDANSETRON HCL 4 MG/2ML IJ SOLN: 4.0000 mg | Freq: Four times a day (QID) | INTRAMUSCULAR | Status: DC | PRN

## 2021-09-06 MED ORDER — NIFEDIPINE ER OSMOTIC RELEASE 30 MG PO TB24
30.0000 mg | ORAL_TABLET | Freq: Every day | ORAL | Status: DC
  Administered 2021-09-06 – 2021-09-09 (×4): 30 mg via ORAL
  Filled 2021-09-06 (×5): qty 1

## 2021-09-06 MED ORDER — PENICILLIN G POT IN DEXTROSE 60000 UNIT/ML IV SOLN
3.0000 10*6.[IU] | INTRAVENOUS | Status: DC
  Administered 2021-09-06 – 2021-09-07 (×4): 3 10*6.[IU] via INTRAVENOUS
  Filled 2021-09-06 (×4): qty 50

## 2021-09-06 MED ORDER — LIDOCAINE HCL (PF) 1 % IJ SOLN: 30.0000 mL | INTRAMUSCULAR | Status: DC | PRN

## 2021-09-06 MED ORDER — OXYCODONE-ACETAMINOPHEN 5-325 MG PO TABS: 2.0000 | ORAL_TABLET | ORAL | Status: DC | PRN

## 2021-09-06 MED ORDER — MISOPROSTOL 50MCG HALF TABLET
50.0000 ug | ORAL_TABLET | ORAL | Status: DC
  Administered 2021-09-06 (×3): 50 ug via ORAL
  Filled 2021-09-06 (×3): qty 1

## 2021-09-06 MED ORDER — FENTANYL CITRATE (PF) 100 MCG/2ML IJ SOLN
100.0000 ug | INTRAMUSCULAR | Status: DC | PRN
  Administered 2021-09-06: 100 ug via INTRAVENOUS
  Filled 2021-09-06: qty 2

## 2021-09-06 MED ORDER — OXYTOCIN-SODIUM CHLORIDE 30-0.9 UT/500ML-% IV SOLN
2.5000 [IU]/h | INTRAVENOUS | Status: DC
Start: 2021-09-06 — End: 2021-09-07
  Filled 2021-09-06: qty 500

## 2021-09-06 MED ORDER — LACTATED RINGERS IV SOLN: INTRAVENOUS | Status: DC

## 2021-09-06 MED ORDER — ACETAMINOPHEN 325 MG PO TABS
650.0000 mg | ORAL_TABLET | ORAL | Status: DC | PRN
  Filled 2021-09-06: qty 2

## 2021-09-06 MED ORDER — LACTATED RINGERS IV SOLN: 500.0000 mL | INTRAVENOUS | Status: DC | PRN

## 2021-09-06 MED ORDER — SOD CITRATE-CITRIC ACID 500-334 MG/5ML PO SOLN: 30.0000 mL | ORAL | Status: DC | PRN

## 2021-09-06 NOTE — Progress Notes (Signed)
Labor Progress Note Regina Mckee is a 25 y.o. G1P0 at [redacted]w[redacted]d presented for PPROM on 9/13 at 1400 S: Feeling contractions strongly. Enjoying getting up, walking around, getting to bathroom independently.   O:  BP 134/88   Pulse 76   Temp 98.4 F (36.9 C) (Oral)   Resp 18   Ht 5\' 3"  (1.6 m)   Wt 85.5 kg   LMP 12/24/2020 (Exact Date)   SpO2 99%   BMI 33.37 kg/m  EFM: Poor tracing with patient movement, but intermittent strip appears reactive. Baseline 130  SVE: Dilation: 0 Effacement (%): 50 Station: -2 Presentation: Vertex Exam by:: Dr [email or web address]   [name]: 25 y.o. G1P0 [redacted]w[redacted]d PPROM >24hrs #Labor: Progressing well. S/p cytotec x2. No dilation yet, but cervix 50% effaced and softening. Cook catheter placed with speculum assistance. Will also re-dose cytotec at 2215.  #Pain: PRN, planning epidural #FWB: Cat I  #GBS  unknown, PCN Anticipate NSVD  2216, MD 9:56 PM

## 2021-09-06 NOTE — MAU Provider Note (Signed)
Event Date/Time   First Provider Initiated Contact with Patient 09/06/21 1124       S: Ms. Regina Mckee is a 25 y.o. G1P0 at [redacted]w[redacted]d  who presents to MAU today complaining of leaking of fluid since 1400 on Tuesday Sept 13. She denies vaginal bleeding. She endorses contractions. She reports normal fetal movement.    O: BP 131/88   Pulse 96   Temp 98.2 F (36.8 C) (Oral)   Resp 16   Ht 5\' 3"  (1.6 m)   Wt 85.5 kg   LMP 12/24/2020 (Exact Date)   SpO2 98% Comment: room air  BMI 33.37 kg/m  GENERAL: Well-developed, well-nourished female in no acute distress.  HEAD: Normocephalic, atraumatic.  CHEST: Normal effort of breathing, regular heart rate ABDOMEN: Soft, nontender, gravid PELVIC: Normal external female genitalia. Vagina is pink and rugated. Cervix with normal contour, no lesions. Normal discharge.  Small pooling. Fern Collected  Cervical exam:   Deferred   Fetal Monitoring: FHT: 145 bpm, Mod Var, -Decels, +Accels Toco: Irregular  Results for orders placed or performed during the hospital encounter of 09/06/21 (from the past 24 hour(s))  Fern Test     Status: None   Collection Time: 09/06/21 11:26 AM  Result Value Ref Range   POCT Fern Test Positive = ruptured amniotic membanes      A: SIUP at [redacted]w[redacted]d  SROM Cat I FT  P: Fern Positive Provider to bedside to discuss results Informed that L&D team would decide upon plan, but may highly recommend augmentation as patient at 21 hours of SROM. BSUS completed which confirms cephalic presentation. Patient reports some anxiety, but coping well. Nurse to call L&D for admission orders.    [redacted]w[redacted]d, CNM 09/06/2021 11:30 AM

## 2021-09-06 NOTE — MAU Note (Signed)
Urine in lab 

## 2021-09-06 NOTE — Progress Notes (Signed)
Regina Mckee is a 25 y.o. G1P0 at [redacted]w[redacted]d admitted for IOL for preterm premature ROM on 9/13 ~1400  Subjective: Reports feels contractions are stronger. Denies headaches and vision changes.   Objective: BP 134/88   Pulse 76   Temp 98.7 F (37.1 C) (Oral)   Resp 18   Ht 5\' 3"  (1.6 m)   Wt 85.5 kg   LMP 12/24/2020 (Exact Date)   SpO2 99%   BMI 33.37 kg/m  No intake/output data recorded. No intake/output data recorded.  FHT:  FHR: 145 bpm, variability: moderate,  accelerations:  Present,  decelerations:  Absent UC:   irregular, every 3-7 minutes SVE:   Dilation: Closed (visually) Exam by:: Dr [email or web address]  Labs: Lab Results  Component Value Date   WBC 13.7 (H) 09/06/2021   HGB 10.9 (L) 09/06/2021   HCT 33.9 (L) 09/06/2021   MCV 84.3 09/06/2021   PLT 299 09/06/2021    Assessment / Plan: Induction of labor due to PPROM,  in early stages of induction  Labor:  cervix still closed/thick/-2 . Will plan for second cytotec and assess for balloon at next exam.  Preeclampsia:  no signs or symptoms of toxicity and urine p:c sent - will follow up Fetal Wellbeing:  Category I Pain Control:   planning epidural I/D:   GBS unknown, PCN Anticipated MOD:  NSVD  09/08/2021 09/06/2021, 5:48 PM

## 2021-09-06 NOTE — MAU Note (Signed)
Regina Mckee is a 25 y.o. at [redacted]w[redacted]d here in MAU reporting: LOF since last night, states it is clear. Wants to make sure baby is not losing oxygen. States she had a gush and soaked through her clothes and wore a pad last night, has felt more leaking today. No recent IC. No bleeding. Having some pressure. +FM  Onset of complaint: last night  Pain score: 0/10  Vitals:   09/06/21 1051  BP: 138/90  Pulse: 93  Resp: 16  Temp: 98.2 F (36.8 C)  SpO2: 98%     FHT:153  Lab orders placed from triage: none

## 2021-09-06 NOTE — H&P (Signed)
OBSTETRIC ADMISSION HISTORY AND PHYSICAL  Hibba T McGregor-Shabazz is a 25 y.o. female G1P0 with IUP at [redacted]w[redacted]d by LMP presenting for preterm premature rupture of membranes. She states she had a gush of fluid around 2PM on 9/13. She initially thought she had urinated on herself but put a pad in and it was soaked.  She reports +FMs,  no VB, no blurry vision, headaches or peripheral edema, and RUQ pain.  She plans on breast feeding. She requests depo for birth control. She received her prenatal care at [facility]   Dating: By LMP --->  Estimated Date of Delivery: 09/30/21  Sono:    @[redacted]w[redacted]d , CWD, normal anatomy, cephalic presentation, anterior placental lie, 2633g, 23%ile EFW   Prenatal History/Complications:  cHTN - on Nifedipine GBS unknown since preterm  Past Medical History: Past Medical History:  Diagnosis Date   Asthma    Eczema    History of ADHD    Hypertension     Past Surgical History: Past Surgical History:  Procedure Laterality Date   NO PAST SURGERIES      Obstetrical History: OB History     Gravida  1   Para      Term      Preterm      AB      Living         SAB      IAB      Ectopic      Multiple      Live Births              Social History Social History   Socioeconomic History   Marital status: Single    Spouse name: Not on file   Number of children: Not on file   Years of education: Not on file   Highest education level: Not on file  Occupational History   Not on file  Tobacco Use   Smoking status: Never   Smokeless tobacco: Never  Vaping Use   Vaping Use: Never used  Substance and Sexual Activity   Alcohol use: Not Currently   Drug use: Not Currently    Types: Marijuana   Sexual activity: Yes    Birth control/protection: None  Other Topics Concern   Not on file  Social History Narrative   Not on file   Social Determinants of Health   Financial Resource Strain: Not on file  Food Insecurity: No Food Insecurity    Worried About Running Out of Food in the Last Year: Never true   Ran Out of Food in the Last Year: Never true  Transportation Needs: No Transportation Needs   Lack of Transportation (Medical): No   Lack of Transportation (Non-Medical): No  Physical Activity: Not on file  Stress: Not on file  Social Connections: Not on file    Family History: Family History  Problem Relation Age of Onset   Stroke Other    Hypertension Other    Cancer Other    Diabetes Other    Hypertension Father    Diabetes Father     Allergies: Allergies  Allergen Reactions   Other Itching    Cherries cause throat to itch.    Medications Prior to Admission  Medication Sig Dispense Refill Last Dose   cyclobenzaprine (FLEXERIL) 10 MG tablet Take 1 tablet (10 mg total) by mouth 3 (three) times daily as needed. 30 tablet 0 09/05/2021   NIFEdipine (PROCARDIA-XL/NIFEDICAL-XL) 30 MG 24 hr tablet Take 1 tablet (30 mg total) by  mouth daily. 60 tablet 0 Past Week   Prenatal Vit-Fe Fumarate-FA (PREPLUS) 27-1 MG TABS Take 1 tablet by mouth daily. 30 tablet 13 09/05/2021   promethazine (PHENERGAN) 25 MG tablet Take 1 tablet (25 mg total) by mouth every 6 (six) hours as needed for nausea or vomiting. 30 tablet 1 Past Week   Blood Pressure Monitor DEVI 1 Device by Does not apply route as needed. 1 each 0      Review of Systems   All systems reviewed and negative except as stated in HPI  Blood pressure (!) 134/94, pulse 99, temperature 98.2 F (36.8 C), temperature source Oral, resp. rate 16, height 5\' 3"  (1.6 m), weight 85.5 kg, last menstrual period 12/24/2020, SpO2 99 %. General appearance: alert Lungs: clear to auscultation bilaterally Heart: regular rate and rhythm Abdomen: soft, non-tender; bowel sounds normal Extremities: Homans sign is negative, no sign of DVT Presentation: cephalic on BSUS Fetal monitoringBaseline: 145 bpm, Variability: Good {> 6 bpm), Accelerations: Reactive, and Decelerations:  Absent Uterine activityFrequency: irregular and infrequent Dilation: Closed (visually) Exam by:: [email or web address], cnm   Prenatal labs: ABO, Rh: O/Positive/-- (04/08 0947) Antibody: Negative (04/08 0947) Rubella: 3.53 (04/08 0947) RPR: Non Reactive (07/15 0814)  HBsAg: Negative (04/08 0947)  HIV: Non Reactive (07/15 0814)  GBS:   unknown  2 hr Glucola normal Genetic screening  normal Anatomy 04-13-1993 normal  Prenatal Transfer Tool  Maternal Diabetes: No Genetic Screening: Normal Maternal Ultrasounds/Referrals: Normal Fetal Ultrasounds or other Referrals:  None Maternal Substance Abuse:  No Significant Maternal Medications:  Meds include: Other: Procardia 30mg  Daily Significant Maternal Lab Results: Other: GBS unknown  Results for orders placed or performed during the hospital encounter of 09/06/21 (from the past 24 hour(s))  Fern Test   Collection Time: 09/06/21 11:26 AM  Result Value Ref Range   POCT Fern Test Positive = ruptured amniotic membanes     Patient Active Problem List   Diagnosis Date Noted   Preterm premature rupture of membranes 09/06/2021   Chronic hypertension during pregnancy, antepartum 03/31/2021   Supervision of high risk pregnancy, antepartum 03/31/2021   BMI 30s 03/31/2021   Obesity in pregnancy 03/31/2021    Assessment/Plan:  Lizzett T McGregor-Shabazz is a 25 y.o. G1P0 at [redacted]w[redacted]d here for preterm premature rupture of membranes (at ~1400 on 9/13)  #PPROM Patient reports felt like she urinated on herself around 1400 on 9/13, but the leaking continued and she placed pad and got soaked. Clear fluid without meconium. Discussed with Dr. 10/13 regarding betamethasone and given limited data for late preterm (34-36wks) will hold off on steroids. Given ROM >24 hrs will start induction as listed below.  #Labor:Cervix closed in MAU, confirmed vertex on bedside 10/13 and starting buccal Cytotec. Will assess for balloon placement at next check.  #Pain: Planning  epidural #FWB: Cat I #ID:  Unknown (preterm), started on PCN. Will send GBS culture swab for baby's care #MOF: Breast #MOC:Depo #Circ:  N/A  Alvester Morin, MD  09/06/2021, 12:02 PM

## 2021-09-07 ENCOUNTER — Inpatient Hospital Stay (HOSPITAL_COMMUNITY): Payer: Medicaid Other | Admitting: Anesthesiology

## 2021-09-07 ENCOUNTER — Encounter: Payer: Medicaid Other | Admitting: Nurse Practitioner

## 2021-09-07 ENCOUNTER — Encounter (HOSPITAL_COMMUNITY): Payer: Self-pay | Admitting: Family Medicine

## 2021-09-07 DIAGNOSIS — Z3A36 36 weeks gestation of pregnancy: Secondary | ICD-10-CM

## 2021-09-07 DIAGNOSIS — O99824 Streptococcus B carrier state complicating childbirth: Secondary | ICD-10-CM

## 2021-09-07 DIAGNOSIS — O1092 Unspecified pre-existing hypertension complicating childbirth: Secondary | ICD-10-CM

## 2021-09-07 DIAGNOSIS — O42113 Preterm premature rupture of membranes, onset of labor more than 24 hours following rupture, third trimester: Secondary | ICD-10-CM

## 2021-09-07 LAB — CBC
HCT: 32.8 % — ABNORMAL LOW (ref 36.0–46.0)
Hemoglobin: 10.9 g/dL — ABNORMAL LOW (ref 12.0–15.0)
MCH: 27.4 pg (ref 26.0–34.0)
MCHC: 33.2 g/dL (ref 30.0–36.0)
MCV: 82.4 fL (ref 80.0–100.0)
Platelets: 298 10*3/uL (ref 150–400)
RBC: 3.98 MIL/uL (ref 3.87–5.11)
RDW: 13.1 % (ref 11.5–15.5)
WBC: 17.7 10*3/uL — ABNORMAL HIGH (ref 4.0–10.5)
nRBC: 0 % (ref 0.0–0.2)

## 2021-09-07 LAB — RPR: RPR Ser Ql: NONREACTIVE

## 2021-09-07 MED ORDER — SODIUM CHLORIDE 0.9 % IV SOLN
12.5000 mg | Freq: Once | INTRAVENOUS | Status: AC
  Administered 2021-09-07: 12.5 mg via INTRAVENOUS
  Filled 2021-09-07: qty 0.5

## 2021-09-07 MED ORDER — SENNOSIDES-DOCUSATE SODIUM 8.6-50 MG PO TABS
2.0000 | ORAL_TABLET | Freq: Every day | ORAL | Status: DC
  Administered 2021-09-08 – 2021-09-09 (×2): 2 via ORAL
  Filled 2021-09-07 (×2): qty 2

## 2021-09-07 MED ORDER — FENTANYL-BUPIVACAINE-NACL 0.5-0.125-0.9 MG/250ML-% EP SOLN
12.0000 mL/h | EPIDURAL | Status: DC | PRN
  Administered 2021-09-07: 12 mL/h via EPIDURAL

## 2021-09-07 MED ORDER — DIPHENHYDRAMINE HCL 50 MG/ML IJ SOLN: 12.5000 mg | INTRAMUSCULAR | Status: DC | PRN

## 2021-09-07 MED ORDER — ONDANSETRON HCL 4 MG PO TABS: 4.0000 mg | ORAL_TABLET | ORAL | Status: DC | PRN

## 2021-09-07 MED ORDER — IBUPROFEN 600 MG PO TABS
600.0000 mg | ORAL_TABLET | Freq: Four times a day (QID) | ORAL | Status: DC
  Administered 2021-09-07 – 2021-09-09 (×10): 600 mg via ORAL
  Filled 2021-09-07 (×10): qty 1

## 2021-09-07 MED ORDER — PRENATAL MULTIVITAMIN CH
1.0000 | ORAL_TABLET | Freq: Every day | ORAL | Status: DC
  Administered 2021-09-07 – 2021-09-09 (×3): 1 via ORAL
  Filled 2021-09-07 (×3): qty 1

## 2021-09-07 MED ORDER — PHENYLEPHRINE 40 MCG/ML (10ML) SYRINGE FOR IV PUSH (FOR BLOOD PRESSURE SUPPORT): 80.0000 ug | PREFILLED_SYRINGE | INTRAVENOUS | Status: DC | PRN

## 2021-09-07 MED ORDER — FENTANYL-BUPIVACAINE-NACL 0.5-0.125-0.9 MG/250ML-% EP SOLN
EPIDURAL | Status: AC
  Filled 2021-09-07: qty 250

## 2021-09-07 MED ORDER — COCONUT OIL OIL: 1.0000 "application " | TOPICAL_OIL | Status: DC | PRN

## 2021-09-07 MED ORDER — ZOLPIDEM TARTRATE 5 MG PO TABS: 5.0000 mg | ORAL_TABLET | Freq: Every evening | ORAL | Status: DC | PRN

## 2021-09-07 MED ORDER — ONDANSETRON HCL 4 MG/2ML IJ SOLN: 4.0000 mg | INTRAMUSCULAR | Status: DC | PRN

## 2021-09-07 MED ORDER — DIPHENHYDRAMINE HCL 25 MG PO CAPS: 25.0000 mg | ORAL_CAPSULE | Freq: Four times a day (QID) | ORAL | Status: DC | PRN

## 2021-09-07 MED ORDER — BENZOCAINE-MENTHOL 20-0.5 % EX AERO
1.0000 "application " | INHALATION_SPRAY | CUTANEOUS | Status: DC | PRN
  Administered 2021-09-07: 1 via TOPICAL

## 2021-09-07 MED ORDER — INFLUENZA VAC SPLIT QUAD 0.5 ML IM SUSY
0.5000 mL | PREFILLED_SYRINGE | INTRAMUSCULAR | Status: AC
  Administered 2021-09-09: 0.5 mL via INTRAMUSCULAR
  Filled 2021-09-07: qty 0.5

## 2021-09-07 MED ORDER — TETANUS-DIPHTH-ACELL PERTUSSIS 5-2.5-18.5 LF-MCG/0.5 IM SUSY: 0.5000 mL | PREFILLED_SYRINGE | Freq: Once | INTRAMUSCULAR | Status: DC

## 2021-09-07 MED ORDER — LIDOCAINE-EPINEPHRINE (PF) 2 %-1:200000 IJ SOLN
INTRAMUSCULAR | Status: DC | PRN
  Administered 2021-09-07: 5 mL via EPIDURAL

## 2021-09-07 MED ORDER — EPHEDRINE 5 MG/ML INJ: 10.0000 mg | INTRAVENOUS | Status: DC | PRN

## 2021-09-07 MED ORDER — ACETAMINOPHEN 325 MG PO TABS: 650.0000 mg | ORAL_TABLET | ORAL | Status: DC | PRN

## 2021-09-07 MED ORDER — DIBUCAINE (PERIANAL) 1 % EX OINT
1.0000 "application " | TOPICAL_OINTMENT | CUTANEOUS | Status: DC | PRN
  Administered 2021-09-07: 1 via RECTAL

## 2021-09-07 MED ORDER — BUTORPHANOL TARTRATE 1 MG/ML IJ SOLN
2.0000 mg | Freq: Once | INTRAMUSCULAR | Status: AC
  Administered 2021-09-07: 2 mg via INTRAVENOUS
  Filled 2021-09-07: qty 2

## 2021-09-07 MED ORDER — SIMETHICONE 80 MG PO CHEW: 80.0000 mg | CHEWABLE_TABLET | ORAL | Status: DC | PRN

## 2021-09-07 MED ORDER — WITCH HAZEL-GLYCERIN EX PADS
1.0000 "application " | MEDICATED_PAD | CUTANEOUS | Status: DC | PRN
  Administered 2021-09-07: 1 via TOPICAL

## 2021-09-07 MED ORDER — OXYTOCIN-SODIUM CHLORIDE 30-0.9 UT/500ML-% IV SOLN
1.0000 m[IU]/min | INTRAVENOUS | Status: DC
  Administered 2021-09-07: 2 m[IU]/min via INTRAVENOUS

## 2021-09-07 MED ORDER — LACTATED RINGERS IV SOLN: 500.0000 mL | Freq: Once | INTRAVENOUS | Status: DC

## 2021-09-07 NOTE — Lactation Note (Signed)
This note was copied from a baby's chart. Lactation Consultation Note  Patient Name: Regina Mckee Today's Date: 09/07/2021 Reason for consult: L&D Initial assessment Age:25 hours P1, infant 36+5 weeks, LPTI, infant showing no signs of feeding cues. Has mouth over nipple when LC arrived to room.  Mother has inverted nipples. Assist mother with hand expression. Mother reports excess amts of colostrum from left nipple. Several attempts to latch infant on left breast. Infant holds her mouth wide open over nipple. No observed tugging or latch.  Mothers left nipple everts when firms. Infant continues to refuse breast.  Discussed feeding cues as early ,  mid and late feeding cues.  Encouraged mother to do frequent STS.  Informed mother the she would receive assistance with infant positioning and latching when she gets to her room.   Maternal Data Does the patient have breastfeeding experience prior to this delivery?: No  Feeding Mother's Current Feeding Choice: Breast Milk  LATCH Score Latch: Too sleepy or reluctant, no latch achieved, no sucking elicited.  Audible Swallowing: None  Type of Nipple: Inverted  Comfort (Breast/Nipple): Soft / non-tender  Hold (Positioning): Full assist, staff holds infant at breast  LATCH Score: 2   Lactation Tools Discussed/Used    Interventions Interventions: Assisted with latch;Skin to skin;Hand express;Adjust position;Support pillows  Discharge    Consult Status Consult Status: Follow-up from L&D    Stevan Born [facility] 09/07/2021, 9:04 AM

## 2021-09-07 NOTE — Lactation Note (Signed)
This note was copied from a baby's chart. Lactation Consultation Note  Patient Name: Regina Mckee Today's Date: 09/07/2021   Age:25 hours LC attempted to follow up with mom and infant. LC entered the room mom and infant was asleep at this time. Maternal Data    Feeding    LATCH Score                    Lactation Tools Discussed/Used    Interventions    Discharge    Consult Status      Regina Mckee 09/07/2021, 11:38 PM

## 2021-09-07 NOTE — Progress Notes (Signed)
Labor Progress Note Regina Mckee is a 25 y.o. G1P0 at [redacted]w[redacted]d presented for PPROM on 9/13 at 1400 S: Discussed care with RN. Patient's foley bulb fell out after ~2 hours. Had been quite uncomfortable with contractions and recently received Stadol and Phenergan and is now sleeping comfortably.   O:  BP 134/88   Pulse 76   Temp 98.4 F (36.9 C) (Oral)   Resp 18   Ht 5\' 3"  (1.6 m)   Wt 85.5 kg   LMP 12/24/2020 (Exact Date)   SpO2 99%   BMI 33.37 kg/m  EFM: 140/moderate/accels present, no decels  CVE: Dilation: 1 Effacement (%): 90 Cervical Position: Posterior Presentation: Vertex Exam by:: [email or web address], RN   [name]: 25 y.o. G1P0 [redacted]w[redacted]d PPROM  #Labor: Progressing well. Due for cervical exam, but given that patient is sleeping, will defer for now and re-check soon. Contracting every 2-3 minutes.  #Pain: PRN, planning epidural #FWB: Cat I  #GBS positive (PCN) #cHTN: BP 130s/80s. Continue home procardia 30mg .   [redacted]w[redacted]d, MD 2:24 AM

## 2021-09-07 NOTE — Discharge Summary (Signed)
Postpartum Discharge Summary  Date of Service updated     Patient Name: Regina Mckee DOB: 04-Aug-1996 MRN: [id number]  Date of admission: 09/06/2021 Delivery date:09/07/2021  Delivering provider: Patriciaann Clan  Date of discharge: 09/09/2021  Admitting diagnosis: Preterm premature rupture of membranes [O42.919] Intrauterine pregnancy: [redacted]w[redacted]d     Secondary diagnosis:  Active Problems:   Chronic hypertension during pregnancy, antepartum   Supervision of high risk pregnancy, antepartum   Preterm premature rupture of membranes  Additional problems: None    Discharge diagnosis: Preterm Pregnancy Delivered                                              Post partum procedures: None Augmentation: Pitocin, Cytotec, and IP Foley Complications: HUD>14 hours  Hospital course: Induction of Labor With Vaginal Delivery   25 y.o. yo G1P0 at [redacted]w[redacted]d was admitted to the hospital 09/06/2021 for induction of labor.  Indication for induction: PPROM.  Patient had an uncomplicated labor course as follows: Membrane Rupture Time/Date: 2:00 PM ,09/05/2021   Delivery Method:Vaginal, Spontaneous  Episiotomy: None  Lacerations:  None  Details of delivery can be found in separate delivery note.  Patient had a routine postpartum course. Patient is discharged home 09/09/21.  Newborn Data: Birth date:09/07/2021  Birth time:7:55 AM  Gender:Female  Living status:Living  Apgars:9 ,9  Weight:2880 g   Magnesium Sulfate received: No BMZ received: No Rhophylac:No MMR:Yes T-DaP:Given prenatally Flu: N/A Transfusion:No  Physical exam  Vitals:   09/08/21 0515 09/08/21 1621 09/08/21 2111 09/09/21 0835  BP: 114/80 127/84 123/83 132/71  Pulse: 76 93 69 79  Resp: $Remo'18 18 17 16  'DHICk$ Temp: 98.2 F (36.8 C) 98.7 F (37.1 C) 98.4 F (36.9 C) 99 F (37.2 C)  TempSrc: Oral Oral Oral Oral  SpO2: 100%  100% 98%  Weight:      Height:       General: alert, cooperative, and no distress Lochia:  appropriate Uterine Fundus: firm Incision: N/A DVT Evaluation: No significant calf/ankle edema. Labs: Lab Results  Component Value Date   WBC 12.8 (H) 09/08/2021   HGB 9.2 (L) 09/08/2021   HCT 27.8 (L) 09/08/2021   MCV 84.0 09/08/2021   PLT 228 09/08/2021   CMP Latest Ref Rng & Units 09/06/2021  Glucose 70 - 99 mg/dL 69(L)  BUN 6 - 20 mg/dL <5(L)  Creatinine 0.44 - 1.00 mg/dL 0.65  Sodium 135 - 145 mmol/L 137  Potassium 3.5 - 5.1 mmol/L 3.3(L)  Chloride 98 - 111 mmol/L 107  CO2 22 - 32 mmol/L 23  Calcium 8.9 - 10.3 mg/dL 8.6(L)  Total Protein 6.5 - 8.1 g/dL 6.2(L)  Total Bilirubin 0.3 - 1.2 mg/dL 0.3  Alkaline Phos 38 - 126 U/L 146(H)  AST 15 - 41 U/L 13(L)  ALT 0 - 44 U/L 13   Edinburgh Score: Edinburgh Postnatal Depression Scale Screening Tool 09/08/2021  I have been able to laugh and see the funny side of things. 0  I have looked forward with enjoyment to things. 0  I have blamed myself unnecessarily when things went wrong. 0  I have been anxious or worried for no good reason. 0  I have felt scared or panicky for no good reason. 0  Things have been getting on top of me. 0  I have been so unhappy that I have had difficulty  sleeping. 0  I have felt sad or miserable. 0  I have been so unhappy that I have been crying. 0  The thought of harming myself has occurred to me. 0  Edinburgh Postnatal Depression Scale Total 0     After visit meds:  Allergies as of 09/09/2021       Reactions   Other Itching   Cherries cause throat to itch.        Medication List     STOP taking these medications    cyclobenzaprine 10 MG tablet Commonly known as: FLEXERIL   promethazine 25 MG tablet Commonly known as: PHENERGAN       TAKE these medications    Blood Pressure Monitor Devi 1 Device by Does not apply route as needed.   ferrous sulfate 325 (65 FE) MG tablet Take 1 tablet (325 mg total) by mouth every other day. Start taking on: September 10, 2021   ibuprofen  600 MG tablet Commonly known as: ADVIL Take 1 tablet (600 mg total) by mouth every 6 (six) hours.   NIFEdipine 30 MG 24 hr tablet Commonly known as: PROCARDIA-XL/NIFEDICAL-XL Take 1 tablet (30 mg total) by mouth daily. What changed: Another medication with the same name was added. Make sure you understand how and when to take each.   NIFEdipine 30 MG 24 hr tablet Commonly known as: PROCARDIA-XL/NIFEDICAL-XL Take 1 tablet (30 mg total) by mouth daily. What changed: You were already taking a medication with the same name, and this prescription was added. Make sure you understand how and when to take each.   PrePLUS 27-1 MG Tabs Take 1 tablet by mouth daily.         Discharge home in stable condition Infant Feeding: Bottle and breast Infant Disposition:home with mother Discharge instruction: per After Visit Summary and Postpartum booklet. Activity: Advance as tolerated. Pelvic rest for 6 weeks.  Diet: routine diet Future Appointments: Future Appointments  Date Time Provider [place]  09/14/2021  9:15 AM Aletha Halim, MD [facility]  09/14/2021 10:15 AM [facility]   Follow up Visit:  Message sent to [facility] by Dr Higinio Plan on 09/07/2021:   Please schedule this patient for a In person postpartum visit in 4 weeks with the following provider: Any provider. Additional Postpartum F/U:BP check 1 week  High risk pregnancy complicated by: HTN Chronic HTN on procardia  Delivery mode:  Vaginal, Spontaneous  Anticipated Birth Control:  Depo   09/09/2021 Patriciaann Clan, DO

## 2021-09-07 NOTE — Anesthesia Preprocedure Evaluation (Signed)
Anesthesia Evaluation  Patient identified by MRN, date of birth, ID band Patient awake    Reviewed: Allergy & Precautions, NPO status , Patient's Chart, lab work & pertinent test results  Airway Mallampati: II  TM Distance: >3 FB Neck ROM: Full    Dental no notable dental hx.    Pulmonary neg pulmonary ROS,    Pulmonary exam normal breath sounds clear to auscultation       Cardiovascular hypertension, Pt. on medications Normal cardiovascular exam Rhythm:Regular Rate:Normal     Neuro/Psych negative neurological ROS  negative psych ROS   GI/Hepatic negative GI ROS, Neg liver ROS,   Endo/Other  negative endocrine ROS  Renal/GU negative Renal ROS  negative genitourinary   Musculoskeletal negative musculoskeletal ROS (+)   Abdominal   Peds  Hematology negative hematology ROS (+)   Anesthesia Other Findings Presents with PPROM  Reproductive/Obstetrics (+) Pregnancy                             Anesthesia Physical Anesthesia Plan  ASA: 3  Anesthesia Plan: Epidural   Post-op Pain Management:    Induction:   PONV Risk Score and Plan: Treatment may vary due to age or medical condition  Airway Management Planned: Natural Airway  Additional Equipment:   Intra-op Plan:   Post-operative Plan:   Informed Consent: I have reviewed the patients History and Physical, chart, labs and discussed the procedure including the risks, benefits and alternatives for the proposed anesthesia with the patient or authorized representative who has indicated his/her understanding and acceptance.       Plan Discussed with: Anesthesiologist  Anesthesia Plan Comments: (Patient identified. Risks, benefits, options discussed with patient including but not limited to bleeding, infection, nerve damage, paralysis, failed block, incomplete pain control, headache, blood pressure changes, nausea, vomiting, reactions  to medication, itching, and post partum back pain. Confirmed with bedside nurse the patient's most recent platelet count. Confirmed with the patient that they are not taking any anticoagulation, have any bleeding history or any family history of bleeding disorders. Patient expressed understanding and wishes to proceed. All questions were answered. )        Anesthesia Quick Evaluation

## 2021-09-07 NOTE — Progress Notes (Signed)
Labor Progress Note Regina Mckee is a 25 y.o. G1P0 at [redacted]w[redacted]d presented for PPROM on 9/13 at 1400 S: Patient is quite uncomfortable with contractions every 4-81min.   O:  BP 135/73 (BP Location: Right Arm)   Pulse 84   Temp 97.8 F (36.6 C) (Oral)   Resp 17   Ht 5\' 3"  (1.6 m)   Wt 85.5 kg   LMP 12/24/2020 (Exact Date)   SpO2 99%   BMI 33.37 kg/m  EFM: 140/moderate/accels and isolated variable  CVE: Dilation: 3 Effacement (%): 90 Cervical Position: Posterior Station: -2 Presentation: Vertex Exam by:: Dr. [email or web address]   [name]: 25 y.o. G1P0 [redacted]w[redacted]d PPROM  #Labor: Progressing well. Patient very uncomfortable with contractions. Contractions have been very difficult to trace on monitor. FB out about two hours ago. Will start pitocin 2x2.  #Pain: Patient to discuss pain management options with RN and family, but strongly considering epidural at this time #FWB: Cat I  #GBS positive (PCN) #cHTN: BP 135/73, contine home procardia  [redacted]w[redacted]d, MD 4:17 AM

## 2021-09-07 NOTE — Anesthesia Procedure Notes (Signed)
Epidural Patient location during procedure: OB Start time: 09/07/2021 4:30 AM End time: 09/07/2021 4:40 AM  Staffing Anesthesiologist: Elmer Picker, MD Performed: anesthesiologist   Preanesthetic Checklist Completed: patient identified, IV checked, risks and benefits discussed, monitors and equipment checked, pre-op evaluation and timeout performed  Epidural Patient position: sitting Prep: DuraPrep and site prepped and draped Patient monitoring: continuous pulse ox, blood pressure, heart rate and cardiac monitor Approach: midline Location: L3-L4 Injection technique: LOR air  Needle:  Needle type: Tuohy  Needle gauge: 17 G Needle length: 9 cm Needle insertion depth: 7 cm Catheter type: closed end flexible Catheter size: 19 Gauge Catheter at skin depth: 12 cm Test dose: negative  Assessment Sensory level: T8 Events: blood not aspirated, injection not painful, no injection resistance, no paresthesia and negative IV test  Additional Notes Patient identified. Risks/Benefits/Options discussed with patient including but not limited to bleeding, infection, nerve damage, paralysis, failed block, incomplete pain control, headache, blood pressure changes, nausea, vomiting, reactions to medication both or allergic, itching and postpartum back pain. Confirmed with bedside nurse the patient's most recent platelet count. Confirmed with patient that they are not currently taking any anticoagulation, have any bleeding history or any family history of bleeding disorders. Patient expressed understanding and wished to proceed. All questions were answered. Sterile technique was used throughout the entire procedure. Please see nursing notes for vital signs. Test dose was given through epidural catheter and negative prior to continuing to dose epidural or start infusion. Warning signs of high block given to the patient including shortness of breath, tingling/numbness in hands, complete motor block,  or any concerning symptoms with instructions to call for help. Patient was given instructions on fall risk and not to get out of bed. All questions and concerns addressed with instructions to call with any issues or inadequate analgesia.  Reason for block:procedure for pain

## 2021-09-07 NOTE — Lactation Note (Signed)
This note was copied from a baby's chart. Lactation Consultation Note  Patient Name: Regina Mckee Today's Date: 09/07/2021 Reason for consult: Initial assessment;Mother's request;Difficult latch;Primapara;1st time breastfeeding;Late-preterm 34-36.6wks;Other (Comment) (GHTN Nifedipine) Age:25 hours  Infant sleepy. We worked on latching positions and hand expression/spoon feeding.   LPTI guidelines to reduce calorie loss reviewed including keeping total feeding under 30 min.   Plan 1. To feed based on cues 8-12x 24 hr period.  2. Mom to offer breasts and look for signs of milk transfer. If needed 20 NS provided to assist with latching.  3. Mom to offer EBM via spoon if unable to latch (5-10 ml or more) 4. I and O sheet reviewed.  5. LC brochure of inpatient and outpatient services reviewed. All questions answered at the end of the visit.   Maternal Data Has patient been taught Hand Expression?: Yes Does the patient have breastfeeding experience prior to this delivery?: No  Feeding Mother's Current Feeding Choice: Breast Milk  LATCH Score Latch: Repeated attempts needed to sustain latch, nipple held in mouth throughout feeding, stimulation needed to elicit sucking reflex.  Audible Swallowing: A few with stimulation  Type of Nipple: Inverted  Comfort (Breast/Nipple): Soft / non-tender  Hold (Positioning): Assistance needed to correctly position infant at breast and maintain latch.  LATCH Score: 5   Lactation Tools Discussed/Used Tools: Pump;Flanges;Nipple Dorris Carnes (Mom does not have a nursing bra,  not able to wear breast shells.) Nipple shield size: 20 (Infant tired not able to latch at this time even with NS.) Flange Size: 24 Breast pump type: Double-Electric Breast Pump Pump Education: Setup, frequency, and cleaning;Milk Storage Reason for Pumping: increase stimulation Pumping frequency: every 3 hrs for 15 min  Interventions Interventions: Breast  feeding basics reviewed;Breast compression;Assisted with latch;Adjust position;Skin to skin;Support pillows;DEBP;Breast massage;Position options;Hand express;Education;Expressed milk;Pre-pump if needed;Coconut oil  Discharge Pump: Manual WIC Program: Yes  Consult Status Consult Status: Follow-up Date: 09/08/21 Follow-up type: In-patient    Malkie Wille  Nicholson-Springer 09/07/2021, 12:53 PM

## 2021-09-07 NOTE — Anesthesia Postprocedure Evaluation (Signed)
Anesthesia Post Note  Patient: Regina Mckee  Procedure(s) Performed: AN AD HOC LABOR EPIDURAL     Patient location during evaluation: Mother Baby Anesthesia Type: Epidural Level of consciousness: awake and alert Pain management: pain level controlled Vital Signs Assessment: post-procedure vital signs reviewed and stable Respiratory status: spontaneous breathing, nonlabored ventilation and respiratory function stable Cardiovascular status: stable Postop Assessment: no headache, no backache and epidural receding Anesthetic complications: no   No notable events documented.  Last Vitals:  Vitals:   09/07/21 1000 09/07/21 1110  BP: 137/87 135/84  Pulse: 98 87  Resp: 18 17  Temp: 36.9 C 37.2 C  SpO2:      Last Pain:  Vitals:   09/07/21 1110  TempSrc: Oral  PainSc: 0-No pain   Pain Goal:                   Evany Schecter

## 2021-09-08 LAB — CBC
HCT: 27.8 % — ABNORMAL LOW (ref 36.0–46.0)
Hemoglobin: 9.2 g/dL — ABNORMAL LOW (ref 12.0–15.0)
MCH: 27.8 pg (ref 26.0–34.0)
MCHC: 33.1 g/dL (ref 30.0–36.0)
MCV: 84 fL (ref 80.0–100.0)
Platelets: 228 10*3/uL (ref 150–400)
RBC: 3.31 MIL/uL — ABNORMAL LOW (ref 3.87–5.11)
RDW: 13.2 % (ref 11.5–15.5)
WBC: 12.8 10*3/uL — ABNORMAL HIGH (ref 4.0–10.5)
nRBC: 0 % (ref 0.0–0.2)

## 2021-09-08 LAB — CULTURE, BETA STREP (GROUP B ONLY)

## 2021-09-08 MED ORDER — FERROUS SULFATE 325 (65 FE) MG PO TABS
325.0000 mg | ORAL_TABLET | ORAL | Status: DC
  Administered 2021-09-08: 325 mg via ORAL
  Filled 2021-09-08: qty 1

## 2021-09-08 NOTE — Progress Notes (Addendum)
POSTPARTUM PROGRESS NOTE  Post Partum Day 1  Subjective:  Regina Mckee is a 25 y.o. G1P0101 s/p VD at [redacted]w[redacted]d.  She reports she is doing well. No acute events overnight. She denies any problems with ambulating, voiding or po intake. Denies nausea or vomiting.  Pain is well controlled.  Lochia is mild. Feeling discouraged about breastfeeding, trying to get her milk supply in.   Objective: Blood pressure 114/80, pulse 76, temperature 98.2 F (36.8 C), temperature source Oral, resp. rate 18, height 5\' 3"  (1.6 m), weight 85.5 kg, last menstrual period 12/24/2020, SpO2 100 %, unknown if currently breastfeeding.  Physical Exam:  General: alert, cooperative and no distress Chest: no respiratory distress Heart:regular rate, distal pulses intact Uterine Fundus: firm, appropriately tender DVT Evaluation: No calf swelling or tenderness Extremities: minimal edema Skin: warm, dry  Recent Labs    09/07/21 0104 09/08/21 0527  HGB 10.9* 9.2*  HCT 32.8* 27.8*    Assessment/Plan: Regina Mckee is a 25 y.o. G1P0101 s/p VD at [redacted]w[redacted]d   PPD#1 - Doing well  Routine postpartum care  ABLA: Hgb 9.2, from 10.9. asymptomatic. Start ferrous sulfate every other day.   Chronic hypertension: On procardia 30mg . BP appropriate.   Contraception: Depo Feeding: breastfeeding, lactation to see  Dispo: Plan for discharge tomorrow.   LOS: 2 days   [redacted]w[redacted]d, DO  OB Fellow  09/08/2021, 8:56 AM

## 2021-09-08 NOTE — Lactation Note (Addendum)
This note was copied from a baby's chart. Lactation Consultation Note  Patient Name: Regina Mckee Today's Date: 09/08/2021 Reason for consult: Mother's request;Difficult latch;Follow-up assessment;1st time breastfeeding;Late-preterm 34-36.6wks Age:25 years Mom's current feeding choice is breast and donor breast milk. Per mom, infant has not been latching well at the breast, she has been finger feeding infant a few drops of colostrum.  Per mom, she is frustrated due to infant not latching. LC explained common LPTI infant feeding  behaviors with mom due to prematurity.  Mom was given 20 mm NS earlier today by [facility] services due to mom having inverted nipples. Mom attempted to latch infant on her left breast using the cross cradle hold for  less than 3 minutes, infant became easily tired ,  infant briefly latched, mostly licked, tasted and tongue thrust. LC observed infant has a disorganized suck no SSB coordination at this time. Infant would not suckle on LC gloved finger, nor take donor breast milk using purple and gold slow flow bottle nipple, flow was too fast and infant was thrusting and gagging with donor breast milk leaking from side of infant's mouth. When LC observed this infant was switched to Nfant purple and clear nipple, infant took 6 mls with chin support and pace feed in slide lying position. Mom's plan: 1- RN to put in SLP consult for Mother Baby dyad . 2- Mom will continue to latch infant  according to LPTI feeding guidelines asking RN/LC for latch assistance if needed using the 20 mm NS. 3- Mom will  supplement infant with any EBM first and then donor breast milk using the Nfant ( purple and clear) infant  bottle nipple.  4- Mom will continue to use DEBP every 3 hours for 15 minutes on initial setting. 5- Mom will do lots of skin to skin with infant.    Maternal Data    Feeding Mother's Current Feeding Choice: Breast Milk and Donor Milk  LATCH  Score Latch: Too sleepy or reluctant, no latch achieved, no sucking elicited. (Mom fitted with 20 mm NS due to having inverted nipples.)  Audible Swallowing: None  Type of Nipple: Inverted  Comfort (Breast/Nipple): Soft / non-tender  Hold (Positioning): Assistance needed to correctly position infant at breast and maintain latch.  LATCH Score: 3   Lactation Tools Discussed/Used Tools: Nipple Shields Nipple shield size: 20  Interventions Interventions: Support pillows;Adjust position;Position options;Expressed milk;Skin to skin;Assisted with latch;Education;Pace feeding  Discharge    Consult Status Date: 09/08/21 Follow-up type: In-patient    Danelle Earthly 09/08/2021, 2:33 AM

## 2021-09-08 NOTE — Lactation Note (Signed)
This note was copied from a baby's chart. Lactation Consultation Note  Patient Name: Regina Mckee Today's Date: 09/08/2021 Reason for consult: Follow-up assessment;Mother's request;Difficult latch;Late-preterm 34-36.6wks;Other (Comment) (SLP consult) Age:25 hours  LC talked with Mom to see how feeding going. Mom states latching at breast as improved. LC helped Mom to latch and showed difference between depth of swallow and audible swallows heard with breast compression.   Mom aware should supplement after latching each time, keep total feeding under 30 min.   Mom pumping using dEBP q3 hr for 15 min.   Plan 1. Mom following feeding plan established by SLP feeding infant by cues not going more than 3 hrs without a feeding.  Mom latching at breast and looking for signs of milk transfer. 2.supplementing with EBM first followed by DBM ( 10-20 ml ) with Nfant nipple provided.   If infant not latching at breast, Mom aware to offer more 3 Mom to pump with DEBP as listed above.   LC reviewed feeding plan with RN, Tylene Fantasia   Maternal Data    Feeding Mother's Current Feeding Choice: Breast Milk and Donor Milk Nipple Type: Nfant Extra Slow Flow (gold)  LATCH Score Latch: Repeated attempts needed to sustain latch, nipple held in mouth throughout feeding, stimulation needed to elicit sucking reflex.  Audible Swallowing: A few with stimulation  Type of Nipple: Everted at rest and after stimulation  Comfort (Breast/Nipple): Soft / non-tender  Hold (Positioning): No assistance needed to correctly position infant at breast.  LATCH Score: 8   Lactation Tools Discussed/Used Tools: Pump;Flanges Flange Size: 24 Breast pump type: Double-Electric Breast Pump Pump Education: Setup, frequency, and cleaning;Milk Storage Reason for Pumping: increase stimulation Pumping frequency: every 3 hrs for 15 min  Interventions Interventions: Breast feeding basics reviewed;Breast  compression;Assisted with latch;Adjust position;Skin to skin;DEBP;Hand pump;Breast massage;Position options;Hand express;Expressed milk;Education;Pace feeding;Coconut oil;Support pillows  Discharge    Consult Status Consult Status: Follow-up Date: 09/09/21 Follow-up type: In-patient    Hendry Speas  Nicholson-Springer 09/08/2021, 5:01 PM

## 2021-09-08 NOTE — Social Work (Signed)
CSW received consult for hx of marijuana use.  Referral was screened out due to the following:  ~MOB had no documented substance use after initial prenatal visit/+UPT. ~MOB had no positive drug screens after initial prenatal visit/+UPT. ~Baby's UDS is negative.  CSW will monitor CDS results and make report to Child Protective Services if warranted.   MOB was referred for history of ADHD.  * Referral screened out by Clinical Social Worker because none of the following criteria appear to apply: ~ History of ADHD during this pregnancy, or of post-partum depression following prior delivery. ~ Diagnosis of anxiety and/or depression within last 3 years. Per chart review, it is noted ADHD diagnosis in 2013.   Please contact the Clinical Social Worker if needs arise, by MOB request, or if MOB scores greater than 9/yes to question 10 on Edinburgh Postpartum Depression Screen.   Shavone Nevers, MSW, LCSW [facility]  Clinical Social Worker  [phone] 09/08/2021  9:23 AM  

## 2021-09-08 NOTE — Lactation Note (Signed)
This note was copied from a baby's chart. Lactation Consultation Note  Patient Name: Regina Mckee Today's Date: 09/08/2021 Reason for consult: Follow-up assessment;Mother's request;1st time breastfeeding;Late-preterm 34-36.6wks (Mom was concern due breast swelling and fullness .) Age:25 hours, LPTI with -3% weight loss, seen by SLP earlier today. LC worked with mom on pumping and recognizing breast fullness to prevent engorgement. Mom pumped 30 mls of EBM while LC  was in room.  Per mom, her breast are feeling better since pumping, mom knows to pump every 3 hours for 15 minutes on initial setting. Mom will pump sooner if breast are feeling full to prevent engorgement. Per mom, infant will latch less than 5 minutes and then tires  easily mom understands this is common in some  LPTI's,. Per mom, infant is now briefly  latching without the NS. LC did not observe latch, infant was recently was given 5 mls of EBM using bottle with gold Nfant nipple. Dad  was doing skin to skin with sleeping infant.  LC discussed due to infant's age to work towards increasing feeding intake  EBM volume, if LPTI latches at breast 10 to 20 mls or as much as infant wants or if infant doesn't latch 15 to 30 mls per feeding. Mom will continue to follow previous feeding plan given earlier by SPL consult: 1- Breastfeed by cues, avoid going past 3 hours without feeding infant , limiting infant's  total feedings to 30 minutes or less , using a  Gold Nfant nipple when bottle feeding. 2- Mom will continue to breastfeed infant swaddled in side-lying position for bottle feedings.  3- Mom knows to call RN/LC if she would like assistance with latching infant at the breast.  Maternal Data    Feeding Mother's Current Feeding Choice: Breast Milk  LATCH Score                    Lactation Tools Discussed/Used Breast pump type: Double-Electric Breast Pump Reason for Pumping: Infant poor feeder,  LPTI Pumping frequency: Mom was still pumping when LC left the room Pumped volume: 30 mL  Interventions Interventions: DEBP;Education;Expressed milk;Coconut oil  Discharge Discharge Education: Engorgement and breast care Pump: DEBP  Consult Status Consult Status: Follow-up Date: 09/09/21 Follow-up type: In-patient    Regina Mckee 09/08/2021, 10:59 PM

## 2021-09-09 ENCOUNTER — Ambulatory Visit: Payer: Self-pay

## 2021-09-09 MED ORDER — MEDROXYPROGESTERONE ACETATE 150 MG/ML IM SUSP
150.0000 mg | Freq: Once | INTRAMUSCULAR | Status: AC
  Administered 2021-09-09: 150 mg via INTRAMUSCULAR
  Filled 2021-09-09: qty 1

## 2021-09-09 MED ORDER — FERROUS SULFATE 325 (65 FE) MG PO TABS: 325.0000 mg | ORAL_TABLET | ORAL | 0 refills | Status: DC

## 2021-09-09 MED ORDER — IBUPROFEN 600 MG PO TABS: 600.0000 mg | ORAL_TABLET | Freq: Four times a day (QID) | ORAL | 0 refills | Status: DC

## 2021-09-09 MED ORDER — NIFEDIPINE ER OSMOTIC RELEASE 30 MG PO TB24: 30.0000 mg | ORAL_TABLET | Freq: Every day | ORAL | 0 refills | Status: DC

## 2021-09-09 NOTE — Discharge Instructions (Signed)
Please continue monitoring your BP at home. Goal <130/80. Keep taking procardia 30mg  and follow up at your blood pressure check appt.

## 2021-09-09 NOTE — Progress Notes (Signed)
Pt given and reviewed discharge instructions. Reviewed with pt signs and symptoms of hypertension and when to call the doctor. Pt also given Preeclampsia pamphlet to take home. Pt verbalized understanding.

## 2021-09-09 NOTE — Lactation Note (Signed)
This note was copied from a baby's chart. Lactation Consultation Note  Patient Name: Regina Mckee Today's Date: 09/09/2021 Reason for consult: Follow-up assessment;Late-preterm 34-36.6wks Age:25 hours  Follow up with P1 mother of a 53 hours old infant. Mother is bottlefeeding expressed milk with Dr, Theora Gianotti nipple. Mother reports breast discomfort. Mother explains she has been pumping for 15 minutes every 3h. Noted firm, warm, engorged breasts. Demonstrated massage with reverse pressure using coconut oil. Mother was able to collect 195 mL of EBM combined.   LC discussed with mother pumping following maintenance setting. Explained initial drops, then spraying (letdown), followed by drops again. Pumped for two more minutes when noticing drops after letdown. Discussed that pumping session time will always vary and it will give her stimulation similar to infant feeding from breast. Mother verbalizes in agreement.   Encouraged to request [facility] for any needs, support or questions. Praised mother for her milk supply, effort and dedication.   Plan: 1-Apply ice to breast for 15 minutes at a time at least 30 minutes prior to pumping.  2-Massage breasts using coconut oil prior to pumping. 3-Pump 8-12 in 24 h following maintenance setting.  All questions answered at this time.    Feeding Mother's Current Feeding Choice: Breast Milk  Lactation Tools Discussed/Used Tools: Pump;Flanges;Coconut oil Flange Size: 27 Breast pump type: Double-Electric Breast Pump Pumping frequency: every 3h or sooner as needed for engorgement Pumped volume: 195 mL  Interventions Interventions: DEBP;Education;Reverse pressure;Coconut oil;Expressed milk;Breast massage  Discharge Discharge Education: Engorgement and breast care Pump: DEBP;Manual  Consult Status Consult Status: Follow-up Date: 09/10/21 Follow-up type: In-patient    Anida Deol A Higuera Ancidey 09/09/2021, 9:29 PM

## 2021-09-10 ENCOUNTER — Ambulatory Visit: Payer: Self-pay

## 2021-09-10 NOTE — Lactation Note (Signed)
This note was copied from a baby's chart. Lactation Consultation Note  Patient Name: Regina Mckee Today's Date: 09/10/2021 Reason for consult: Follow-up assessment;Late-preterm 34-36.6wks Age:25 hours  Baby asleep on back in bassinet, dad sitting on couch, mom sitting in bed using DEBP, states continuing to bottle feed with own EBM d/t convenience. Mom plans to latch to the breast in the future. Currently pumping ~3oz per session, states baby consumed ~15mls with last feeding. Mom reports breasts feel much better, no longer engorged.  LC noted several full bottles of mom's EBM in the fridge, praise given. Reinforced collection and milk storage guidelines, cleaning of pump parts. Encouraged mom to f/u with LC at peds office, or [facility] for latch assistance when ready. Mom voiced understanding and with no further concerns. BGilliam, RN, IBCLC   Feeding Mother's Current Feeding Choice: Breast Milk Nipple Type: Dr. Irving Burton Preemie    Interventions Interventions: DEBP  Discharge Discharge Education: Engorgement and breast care  Consult Status Consult Status: Complete Date: 09/10/21 Follow-up type: Out-patient (peds office for routine visit)    Regina Mckee 09/10/2021, 8:58 AM

## 2021-09-10 NOTE — Lactation Note (Signed)
This note was copied from a baby's chart. Lactation Consultation Note  Patient Name: Regina Mckee Today's Date: 09/10/2021 Reason for consult: Follow-up assessment;Late-preterm 34-36.6wks;Primapara;1st time breastfeeding (RN request) Age:25 hours  P1, baby with 3%weight loss. In to see mom per request of M.Leanne Chang, RN to ensure mom aware of pump frequency. Mom reports aware to pump q3hrs, pumping more frequently d/t breasts fill and feel full prior to the 3hr mark. Reinforced continue with pump plan given by previous LC for next day or so to relieve engorged breasts. Routine maintenance will be q3hr. Advised to f/u with lactation if still feels the need to pump more frequently. Mom voiced understanding and with no further concerns. BGilliam, RN, IBCLC  Plan - Continue today with DEBP for engorged breasts. Pump q3hrs (or sooner for comfort) - Pump 20-33min or until sprays d/c - Hands on pumping may be needed to ensure drainage - Goal is to drain breasts not pump until empty - Next day or so follow maintenance pump schedule DEBP q3-4hrs - Report new or worsening breast s/s    Feeding Mother's Current Feeding Choice: Breast Milk Nipple Type: Dr. Irving Burton Preemie    Interventions Interventions: DEBP  Discharge Discharge Education: Engorgement and breast care  Consult Status Consult Status: Complete Date: 09/10/21 Follow-up type: Out-patient (peds office for routine visit)    [name] 09/10/2021, 11:29 AM

## 2021-09-14 ENCOUNTER — Other Ambulatory Visit: Payer: Medicaid Other

## 2021-09-14 ENCOUNTER — Encounter: Payer: Medicaid Other | Admitting: Obstetrics and Gynecology

## 2021-09-20 ENCOUNTER — Telehealth (HOSPITAL_COMMUNITY): Payer: Self-pay

## 2021-09-20 NOTE — Telephone Encounter (Signed)
  No answer. Left message to return nurse call.  Marcelino [facility] 09/20/2021,1818

## 2021-10-04 ENCOUNTER — Telehealth: Payer: Self-pay | Admitting: Lactation Services

## 2021-10-04 MED ORDER — NYSTATIN 100000 UNIT/GM EX CREA: TOPICAL_CREAM | CUTANEOUS | 1 refills | Status: DC

## 2021-10-04 NOTE — Telephone Encounter (Signed)
Called mom at request of Dala Dock, SLP. It is indicated that infant has thrush and mom also with s/s of thrush to breasts. Mom did not answer. LM for her to call the office at [phone] at her convenience to discuss treatment for mom.   My Chart message sent.

## 2021-10-04 NOTE — Telephone Encounter (Signed)
Mom returned call from earlier and LM for Lactation to call her back.   Called patient back and reviewed thrush treatment in mom and infant. Mom is able to see My Chart message. Patient reports redness and irritation to nipples, she does not have intraductal signs of yeast at this time.   Nystatin prescribed per standing order.   Mom would like to wait until thrush is better under control to try to latch infant. She will call for an appointment when she is ready.

## 2021-10-24 ENCOUNTER — Encounter: Payer: Self-pay | Admitting: Family Medicine

## 2021-10-24 ENCOUNTER — Ambulatory Visit (INDEPENDENT_AMBULATORY_CARE_PROVIDER_SITE_OTHER): Payer: Medicaid Other | Admitting: Nurse Practitioner

## 2021-10-24 ENCOUNTER — Encounter: Payer: Self-pay | Admitting: Nurse Practitioner

## 2021-10-24 ENCOUNTER — Other Ambulatory Visit: Payer: Self-pay

## 2021-10-24 NOTE — Progress Notes (Signed)
Post Partum Visit Note  Regina Mckee is a 25 y.o. G53P0101 female who presents for a postpartum visit. She is 6 weeks and 5 days postpartum following a normal spontaneous vaginal delivery.  I have fully reviewed the prenatal and intrapartum course. The delivery was at [redacted]w[redacted]d  gestational weeks due to PPROM.  Anesthesia: epidural. Postpartum course has been good. Baby is doing well. Baby is feeding by both breast and bottle - Regina Mckee . Bleeding no bleeding. Bowel function is abnormal: patient complains of stool softener . Bladder function is normal. Patient is sexually active. Contraception method is Depo-Provera injections. Postpartum depression screening: negative.     Edinburgh Postnatal Depression Scale - 10/24/21 1532       Edinburgh Postnatal Depression Scale:  In the Past 7 Days   I have been able to laugh and see the funny side of things. 0    I have looked forward with enjoyment to things. 1    I have blamed myself unnecessarily when things went wrong. 0    I have been anxious or worried for no good reason. 1    I have felt scared or panicky for no good reason. 0    Things have been getting on top of me. 0    I have been so unhappy that I have had difficulty sleeping. 0    I have felt sad or miserable. 1    I have been so unhappy that I have been crying. 0    The thought of harming myself has occurred to me. 0    Edinburgh Postnatal Depression Scale Total 3             Health Maintenance Due  Topic Date Due   COVID-19 Vaccine (1) Never done   Pneumococcal Vaccine 31-57 Years old (1 - PCV) Never done   HPV VACCINES (1 - 2-dose series) Never done    The following portions of the patient's history were reviewed and updated as appropriate: allergies, current medications, past family history, past medical history, past social history, past surgical history, and problem list.  Review of Systems Pertinent items noted in HPI and remainder of  comprehensive ROS otherwise negative.  Objective:  BP 123/77   Pulse (!) 50   Ht 5\' 3"  (1.6 m)   Wt 171 lb (77.6 kg)   LMP 12/24/2020 (Exact Date)   Breastfeeding Yes   BMI 30.29 kg/m    General:  alert, cooperative, and no distress   Breasts:  not indicated  Lungs: clear to auscultation bilaterally  Heart:  regular rate and rhythm, S1, S2 normal, no murmur, click, rub or gallop  Abdomen: Not examined    Wound  NA  GU exam:  deferred       Assessment:     Normal postpartum exam.   Plan:   Essential components of care per ACOG recommendations:  1.  Mood and well being: Patient with negative depression screening today. Reviewed local resources for support.  - Patient tobacco use? Yes. Patient desires to quit? No.   - hx of drug use? No.    2. Infant care and feeding:  -Patient currently breastmilk feeding? Yes. Patient needs a work note. Patient was provided letter for work to allow for every 2-3 hr pumping breaks, and to be granted a private location to express breastmilk and refrigerated area to store breastmilk.  -Social determinants of health (SDOH) reviewed in EPIC. No concerns  3. Sexuality, contraception and birth  spacing - Patient does not want a pregnancy in the next year.  - Reviewed forms of contraception in tiered fashion. Patient desired  being scheduled for her next depo shot  today.   - Discussed birth spacing of 18 months  4. Sleep and fatigue -Encouraged family/partner/community support of 4 hrs of uninterrupted sleep to help with mood and fatigue  5. Physical Recovery  - Discussed patients delivery and complications. She describes her labor as good. - Patient had a Vaginal, no problems at delivery. Patient had no laceration. Perineal healing reviewed. Patient expressed understanding - Patient has urinary incontinence? No. - Patient is safe to resume physical and sexual activity  6.  Health Maintenance - HM due items addressed Yes - Last pap smear   Diagnosis  Date Value Ref Range Status  03/31/2021   Final   - Negative for intraepithelial lesion or malignancy (NILM)   Pap smear not done at today's visit.  -Breast Cancer screening indicated? No.   7. Chronic Disease/Pregnancy Condition follow up:  Currently is still taking Procardia Advised to stop Procardia in one week and then her Depo shot is in one week after that.   If BP is 130/80 or greater, will need to go see PCP for management of hypertension.  Today's BP is normal and we will check to see if she has a normal BP when she if off Procardia.  - PCP follow up  Regina Paris, NP [facility] [employer], [facility]

## 2021-11-28 ENCOUNTER — Ambulatory Visit: Payer: Medicaid Other

## 2021-12-05 ENCOUNTER — Other Ambulatory Visit: Payer: Self-pay

## 2021-12-05 ENCOUNTER — Ambulatory Visit (INDEPENDENT_AMBULATORY_CARE_PROVIDER_SITE_OTHER): Payer: Medicaid Other

## 2021-12-05 VITALS — BP 132/81 | HR 64 | Wt 174.4 lb

## 2021-12-05 DIAGNOSIS — Z3042 Encounter for surveillance of injectable contraceptive: Secondary | ICD-10-CM | POA: Diagnosis not present

## 2021-12-05 MED ORDER — MEDROXYPROGESTERONE ACETATE 150 MG/ML IM SUSP
150.0000 mg | Freq: Once | INTRAMUSCULAR | Status: AC
  Administered 2021-12-05: 150 mg via INTRAMUSCULAR

## 2021-12-05 NOTE — Progress Notes (Signed)
Regina Mckee here for Depo-Provera Injection. Injection administered without complication. Patient will return in 3 months for next injection between 02/20/22 and 03/06/22. Next annual visit due November 2022.   Patient was advised to stop BP med 1 week following PP exam on 10/24/21. Pt reports stopping as advised. States she checks BP daily at home and this has been normal. BP today is 132/81. Per Marvetta Gibbons, NP note on 10/24/21 patient is to follow up with PCP if higher than 130/80. MyChart message sent to patient after visit to encourage follow up.  Marjo Bicker, RN 12/05/2021  8:37 AM

## 2021-12-29 NOTE — Progress Notes (Signed)
Chart reviewed for nurse visit. Agree with plan of care.   Marylene Land, CNM 12/29/2021 8:04 PM

## 2022-02-17 IMAGING — US US MFM FETAL BPP W/O NON-STRESS
1 series · 14 of 28 positions shown · non-contrast
Comparison: none

[Series 1: us mfm fetal bpp w/o non-stress · 32 acquisitions, 14 frames shown]
[im 2/32]
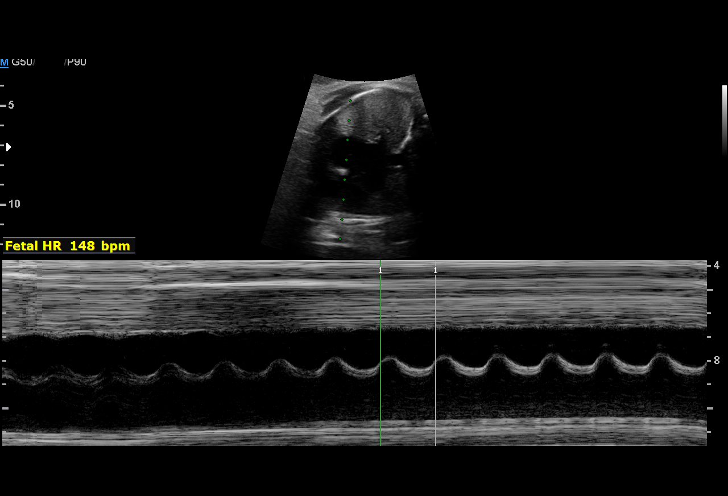
[im 4/32]
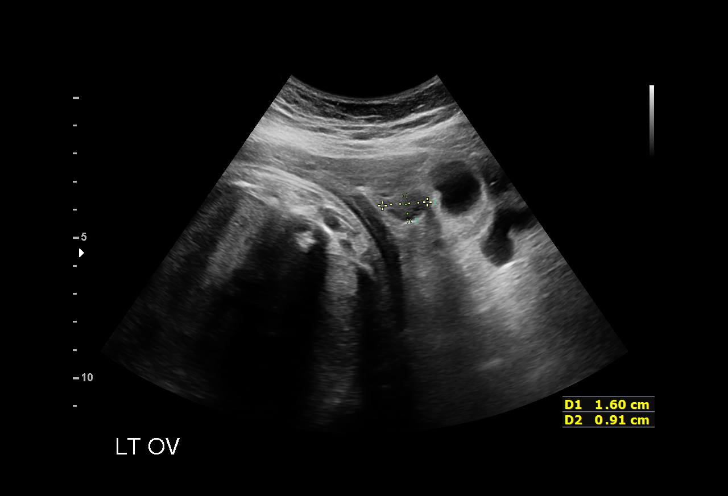
[im 6/32]
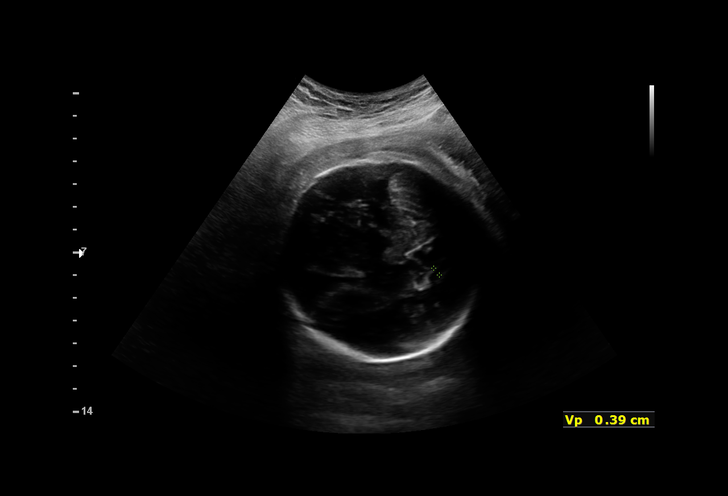
[im 9/32]
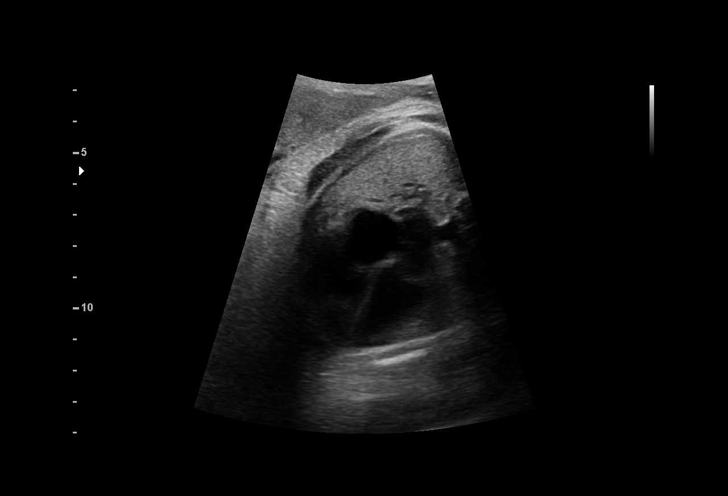
[im 11/32]
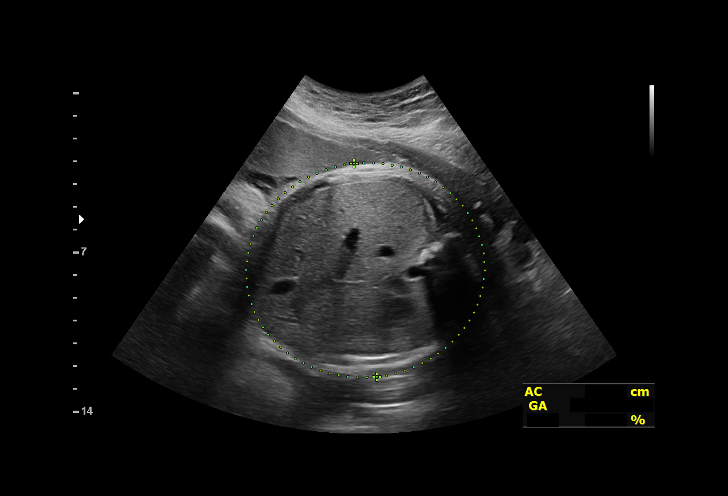
[im 13/32]
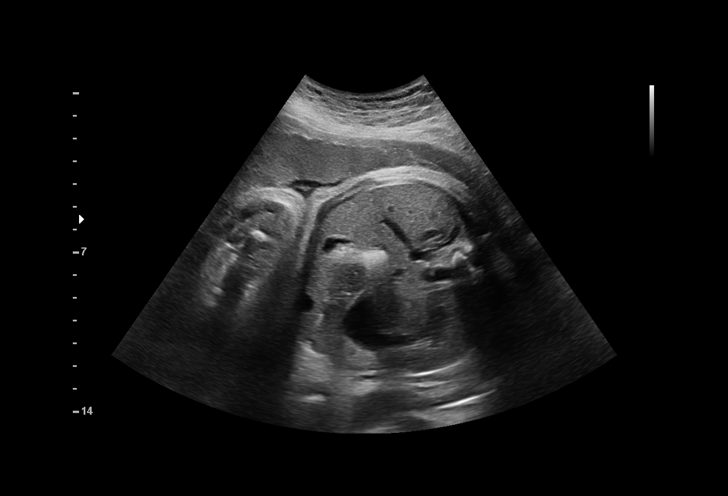
[im 15/32]
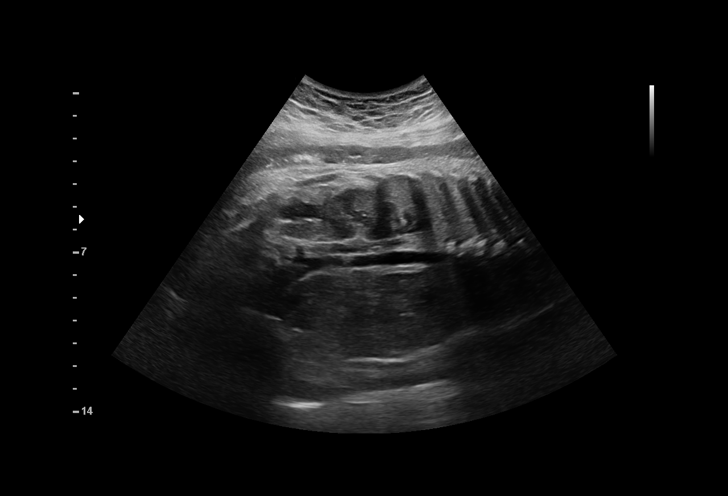
[im 18/32]
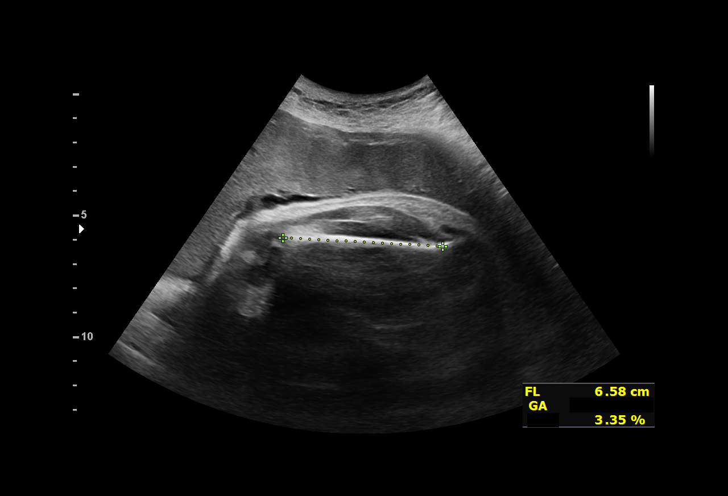
[im 20/32]
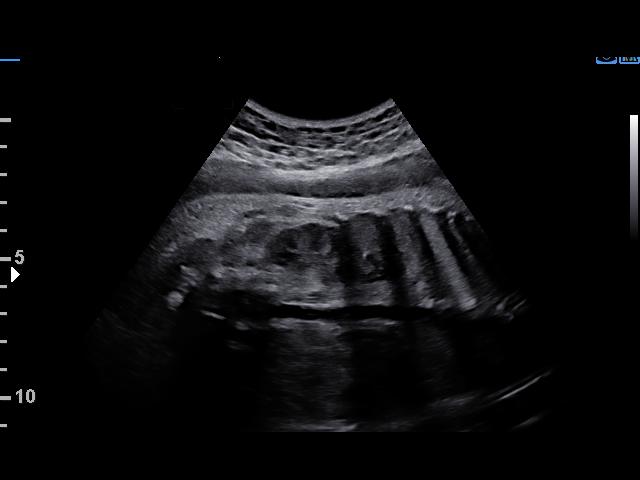
[im 22/32]
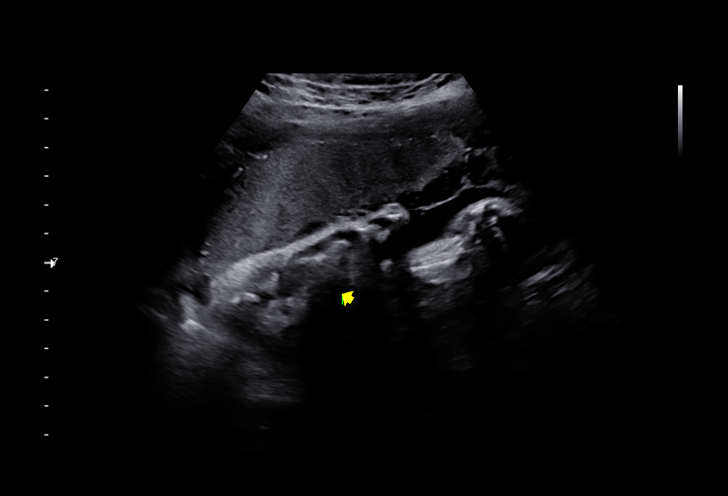
[im 25/32]
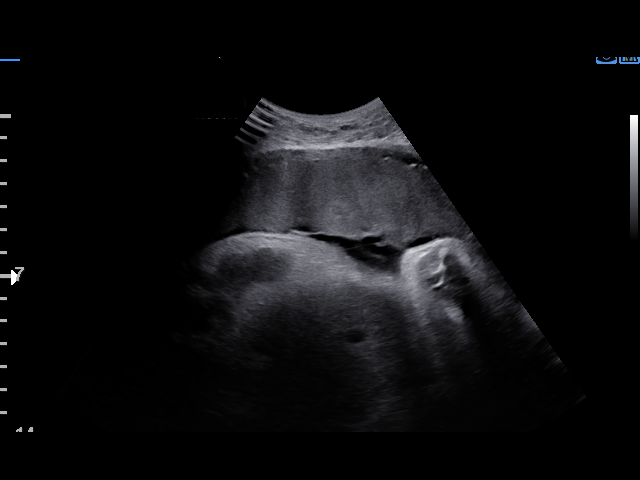
[im 27/32]
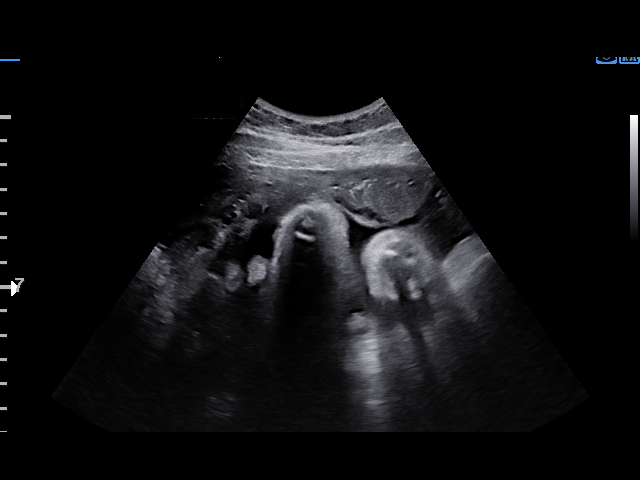
[im 29/32]
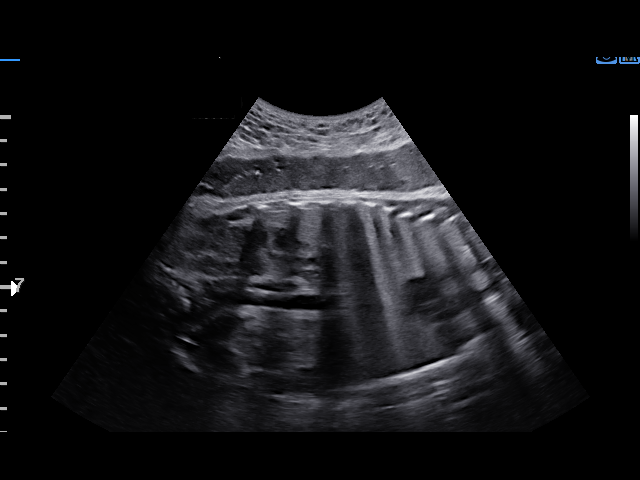
[im 32/32]
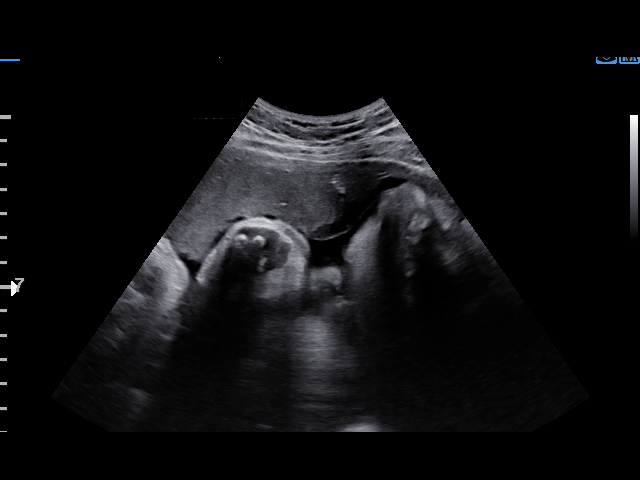

[14 of 28 positions shown; findings below may reference images not displayed]

[REDACTED]

Indications

 Hypertension - Chronic/Pre-existing
 36 weeks gestation of pregnancy
Vital Signs

                                                Height:        5'3"
Fetal Evaluation

 Num Of Fetuses:         1
 Fetal Heart Rate(bpm):  148
 Cardiac Activity:       Observed
 Presentation:           Cephalic
 Placenta:               Anterior
 P. Cord Insertion:      Previously Visualized

 Amniotic Fluid
 AFI FV:      Within normal limits

 AFI Sum(cm)     %Tile       Largest Pocket(cm)
 12.82           44

 RUQ(cm)       RLQ(cm)       LUQ(cm)        LLQ(cm)

Biophysical Evaluation

 Amniotic F.V:   Within normal limits       F. Tone:        Observed
 F. Movement:    Observed                   Score:          [DATE]
 F. Breathing:   Observed
Biometry

 BPD:      86.4  mm     G. Age:  34w 6d         19  %    CI:        77.49   %    70 - 86
                                                         FL/HC:      22.0   %    20.1 -
 HC:      310.7  mm     G. Age:  34w 5d        2.5  %    HC/AC:      0.98        0.93 -
 AC:      315.9  mm     G. Age:  35w 4d         35  %    FL/BPD:     79.2   %    71 - 87
 FL:       68.4  mm     G. Age:  35w 1d         16  %    FL/AC:      21.7   %    20 - 24
 LV:        3.9  mm

 Est. FW:    1422  gm    5 lb 13 oz      23  %
OB History

 Gravidity:    1
Gestational Age

 LMP:           36w 3d        Date:  12/24/20                 EDD:   09/30/21
 U/S Today:     35w 1d                                        EDD:   10/09/21
 Best:          36w 3d     Det. By:  LMP  (12/24/20)          EDD:   09/30/21
Anatomy

 Cranium:               Appears normal         LVOT:                   Previously seen
 Cavum:                 Previously seen        Aortic Arch:            Previously seen
 Ventricles:            Appears normal         Ductal Arch:            Previously seen
 Choroid Plexus:        Previously seen        Diaphragm:              Previously seen
 Cerebellum:            Previously seen        Stomach:                Appears normal, left
                                                                       sided
 Posterior Fossa:       Previously seen        Abdomen:                Previously seen
 Nuchal Fold:           Previously seen        Abdominal Wall:         Previously seen
 Face:                  Orbits and profile     Cord Vessels:           Previously seen
                        previously seen
 Lips:                  Previously seen        Kidneys:                Appear normal
 Palate:                Previously seen        Bladder:                Appears normal
 Thoracic:              Previously seen        Spine:                  Previously seen
 Heart:                 Appears normal         Upper Extremities:      Previously seen
                        (4CH, axis, and
                        situs)
 RVOT:                  Previously seen        Lower Extremities:      Previously seen

 Other:  Female gender previously seen. Nasal bone prev visualized. Lenses
         prev visualized. Heels/RIGHT foot and open hands/5th digits prev
         visualized. VC, 3VV and 3VTV prev visualized.
Cervix Uterus Adnexa

 Cervix
 Not visualized (advanced GA >91wks)
 Right Ovary
 Visualized.

 Left Ovary
 Visualized.
Comments

 This patient was seen for a follow up growth scan and
 biophysical profile due to chronic hypertension treated with
 Procardia.  She denies any problems since her last exam.
 She was informed that the fetal growth and amniotic fluid
 level appears appropriate for her gestational age.
 A biophysical profile performed today was [DATE].
 Due to chronic hypertension treated with Procardia, she
 should continue weekly fetal testing until delivery.  She
 already has weekly biophysical profiles scheduled in your
 office.
 The patient was advised that due to chronic hypertension,
 delivery may occur at between 37 to 39 weeks depending on
 her blood pressures and symptoms.
 No further exams were scheduled in our office.

## 2022-02-20 ENCOUNTER — Ambulatory Visit: Payer: Medicaid Other

## 2022-02-27 ENCOUNTER — Ambulatory Visit: Payer: Medicaid Other

## 2022-03-06 ENCOUNTER — Other Ambulatory Visit: Payer: Self-pay

## 2022-03-06 ENCOUNTER — Ambulatory Visit (INDEPENDENT_AMBULATORY_CARE_PROVIDER_SITE_OTHER): Payer: Medicaid Other

## 2022-03-06 VITALS — BP 115/69 | HR 56 | Wt 169.2 lb

## 2022-03-06 DIAGNOSIS — Z3042 Encounter for surveillance of injectable contraceptive: Secondary | ICD-10-CM | POA: Diagnosis not present

## 2022-03-06 MED ORDER — MEDROXYPROGESTERONE ACETATE 150 MG/ML IM SUSP
150.0000 mg | Freq: Once | INTRAMUSCULAR | Status: AC
  Administered 2022-03-06: 150 mg via INTRAMUSCULAR

## 2022-03-06 NOTE — Progress Notes (Signed)
Zoie T McGregor-Shabazz here for Depo-Provera Injection. Patient reports she has been experiencing vaginal bleeding for past 4 days. Also reports vaginal bleeding March 1-4. Denies any bleeding during month of February. Reviewed with patient that it is normal to have some bleeding prior to receiving next Depo Provera injection. Offered for patient to come at beginning of Depo Provera window since most bleeding occurred during window when she could have received injection. Pt agreeable and plans to return 05/22/22 for next injection. Explained some patients do have breakthrough bleeding while on Depo Provera and patient should contact office if this continues.  ? ?Injection administered without complication. Patient will return in 3 months for next injection between 05/22/22 and 06/05/22. Next annual visit due November 2023.  ? ?Marjo Bicker, RN ?03/06/2022  2:57 PM ? ? ? ?

## 2022-05-24 ENCOUNTER — Ambulatory Visit: Payer: Medicaid Other

## 2022-05-30 ENCOUNTER — Ambulatory Visit (INDEPENDENT_AMBULATORY_CARE_PROVIDER_SITE_OTHER): Payer: Medicaid Other

## 2022-05-30 VITALS — BP 116/66 | HR 78 | Ht 63.0 in | Wt 176.8 lb

## 2022-05-30 DIAGNOSIS — Z3042 Encounter for surveillance of injectable contraceptive: Secondary | ICD-10-CM | POA: Diagnosis not present

## 2022-05-30 MED ORDER — MEDROXYPROGESTERONE ACETATE 150 MG/ML IM SUSP
150.0000 mg | Freq: Once | INTRAMUSCULAR | Status: AC
  Administered 2022-05-30: 150 mg via INTRAMUSCULAR

## 2022-05-30 NOTE — Progress Notes (Signed)
Regina Mckee here for Depo-Provera Injection. Injection administered without complication. Patient will return in 3 months for next injection between August 23 and Sept 6.  Pt advised to schedule annual visit with any provider as her last annual was 03/2021.  Pt verbalized understanding with no further questions.    Ralene Bathe, RN 05/30/2022  4:17 PM

## 2022-08-16 ENCOUNTER — Encounter: Payer: Self-pay | Admitting: Family Medicine

## 2022-08-16 ENCOUNTER — Ambulatory Visit (INDEPENDENT_AMBULATORY_CARE_PROVIDER_SITE_OTHER): Payer: Medicaid Other | Admitting: *Deleted

## 2022-08-16 ENCOUNTER — Other Ambulatory Visit: Payer: Self-pay

## 2022-08-16 VITALS — BP 125/77 | HR 67 | Ht 63.0 in | Wt 180.6 lb

## 2022-08-16 DIAGNOSIS — Z3042 Encounter for surveillance of injectable contraceptive: Secondary | ICD-10-CM | POA: Diagnosis not present

## 2022-08-16 MED ORDER — MEDROXYPROGESTERONE ACETATE 150 MG/ML IM SUSP
150.0000 mg | Freq: Once | INTRAMUSCULAR | Status: AC
Start: 2022-08-16 — End: 2022-08-16
  Administered 2022-08-16: 150 mg via INTRAMUSCULAR

## 2022-08-16 NOTE — Progress Notes (Signed)
Here for depo-provera. Last depo-provera was 05/30/22. Last exam was 10/24/21. Last pap was 03/31/21. Depo-provera given without complaint. Will schedule next injection at checkout . Already has next annual scheduled for 09/20/22.  Nancy Fetter

## 2022-09-20 ENCOUNTER — Ambulatory Visit (INDEPENDENT_AMBULATORY_CARE_PROVIDER_SITE_OTHER): Payer: Medicaid Other | Admitting: Family Medicine

## 2022-09-20 ENCOUNTER — Other Ambulatory Visit: Payer: Self-pay

## 2022-09-20 VITALS — BP 128/83 | HR 66

## 2022-09-20 DIAGNOSIS — Z0000 Encounter for general adult medical examination without abnormal findings: Secondary | ICD-10-CM

## 2022-09-20 DIAGNOSIS — Z23 Encounter for immunization: Secondary | ICD-10-CM | POA: Diagnosis not present

## 2022-09-20 NOTE — Progress Notes (Signed)
   GYNECOLOGY OFFICE VISIT NOTE  History:   Regina Mckee is a 26 y.o. G1P0101 here today for her annual gyn exam. She denies any abnormal vaginal discharge, bleeding, pelvic pain or other concerns.   No concerns today. On depo provera for birth control. Has been doing well with it. Slight spotting monthly, but otherwise vaginal bleeding. Has 1 partner, no concerns of STIs. Had hypertension in pregnancy. Normalized subsequently. Still has a blood pressure cuff at home. Does not have a PCP.   Past Medical History:  Diagnosis Date   Asthma    Eczema    History of ADHD    Hypertension     Past Surgical History:  Procedure Laterality Date   NO PAST SURGERIES      The following portions of the patient's history were reviewed and updated as appropriate: allergies, current medications, past family history, past medical history, past social history, past surgical history and problem list.   Health Maintenance:  Normal pap and negative HRHPV on 03/2021. Next due in 2025.   Not due for mammogram, colonoscopy or DEXA scan due to age. UTD with T Dap Flu shot given today.      Component Value Date/Time   DIAGPAP  03/31/2021 0934    - Negative for intraepithelial lesion or malignancy (NILM)   ADEQPAP  03/31/2021 0934    Satisfactory for evaluation; transformation zone component PRESENT.    Review of Systems:  Pertinent items noted in HPI and remainder of comprehensive ROS otherwise negative.  Physical Exam:  BP 128/83   Pulse 66  CONSTITUTIONAL: Well-developed, well-nourished female in no acute distress.  HEENT:  Normocephalic, atraumatic. External right and left ear normal. No scleral icterus.  NECK: Normal range of motion, supple, no masses noted on observation SKIN: No rash noted. Not diaphoretic. No erythema. No pallor. MUSCULOSKELETAL: Normal range of motion. No edema noted. NEUROLOGIC: Alert and oriented to person, place, and time. Normal muscle tone  coordination. No cranial nerve deficit noted. PSYCHIATRIC: Normal mood and affect. Normal behavior. Normal judgment and thought content. CARDIOVASCULAR: Normal heart rate noted RESPIRATORY: Effort and breath sounds normal, no problems with respiration noted ABDOMEN: No masses noted. No other overt distention noted.   PELVIC: Deferred  Labs and Imaging No results found for this or any previous visit (from the past 168 hour(s)). No results found.    Assessment and Plan:  Regina Mckee was seen today for gynecologic exam.  Encounter for health maintenance examination in adult Doing well overall. UTD on h/maintenance. Takes depo provera shot every 3 months and happy with outcome.  - discussed healthy lifestyle, good diet, excerise.. - discussed checking blood pressure at least 2-3 times a year and following up for evaluation with PCP if elevated, given higher risk based on her history of gHTN.  Needs flu shot -     Flu Vaccine QUAD 14mo+IM (Fluarix, Fluzone & Alfiuria Quad PF)  Follow up as scheduled for depo-provera injection.  Liliane Channel MD MPH OB Fellow, [place] [facility] [place] 09/20/2022

## 2022-11-01 ENCOUNTER — Other Ambulatory Visit: Payer: Self-pay

## 2022-11-01 ENCOUNTER — Ambulatory Visit (INDEPENDENT_AMBULATORY_CARE_PROVIDER_SITE_OTHER): Payer: Medicaid Other

## 2022-11-01 VITALS — BP 147/73 | HR 55 | Ht 63.0 in | Wt 180.0 lb

## 2022-11-01 DIAGNOSIS — Z3042 Encounter for surveillance of injectable contraceptive: Secondary | ICD-10-CM

## 2022-11-01 MED ORDER — MEDROXYPROGESTERONE ACETATE 150 MG/ML IM SUSP
150.0000 mg | Freq: Once | INTRAMUSCULAR | Status: AC
  Administered 2022-11-01: 150 mg via INTRAMUSCULAR

## 2022-11-01 NOTE — Progress Notes (Signed)
Regina Mckee here for Depo-Provera Injection. Injection administered without complication. Patient will return in 3 months for next injection between Jan 25 and Feb 8,2024. Next annual visit due Nov 2023. Pt is going to make appt with next Injection to talk with provider. She is loving her Depo at this time.  Isabell Jarvis, RN 11/01/2022  3:12 PM

## 2022-12-29 ENCOUNTER — Encounter (HOSPITAL_COMMUNITY): Payer: Self-pay | Admitting: Pharmacy Technician

## 2022-12-29 ENCOUNTER — Emergency Department (HOSPITAL_COMMUNITY)
Admission: EM | Admit: 2022-12-29 | Discharge: 2022-12-29 | Disposition: A | Discharge status: Home / Self Care | Payer: Commercial Managed Care - HMO | Attending: Emergency Medicine | Admitting: Emergency Medicine

## 2022-12-29 DIAGNOSIS — Z20822 Contact with and (suspected) exposure to covid-19: Secondary | ICD-10-CM | POA: Diagnosis not present

## 2022-12-29 DIAGNOSIS — J3489 Other specified disorders of nose and nasal sinuses: Secondary | ICD-10-CM | POA: Diagnosis not present

## 2022-12-29 DIAGNOSIS — R059 Cough, unspecified: Secondary | ICD-10-CM | POA: Diagnosis not present

## 2022-12-29 DIAGNOSIS — R6889 Other general symptoms and signs: Secondary | ICD-10-CM

## 2022-12-29 DIAGNOSIS — J029 Acute pharyngitis, unspecified: Secondary | ICD-10-CM | POA: Insufficient documentation

## 2022-12-29 LAB — RESP PANEL BY RT-PCR (RSV, FLU A&B, COVID)  RVPGX2
Influenza A by PCR: NEGATIVE
Influenza B by PCR: NEGATIVE
Resp Syncytial Virus by PCR: NEGATIVE
SARS Coronavirus 2 by RT PCR: NEGATIVE

## 2022-12-29 MED ORDER — BENZONATATE 100 MG PO CAPS: 100.0000 mg | ORAL_CAPSULE | Freq: Three times a day (TID) | ORAL | 0 refills | Status: DC

## 2022-12-29 NOTE — Discharge Instructions (Signed)
You came to the department today due to flulike symptoms.  You will see the results of your swab online.  These sort of viral illnesses are something that can be treated with over-the-counter medications like Tylenol and ibuprofen.  Other options include DayQuil/NyQuil, Mucinex for congestion, Robitussin/Delsym for cough and any other over-the-counter medications.  It is very important that you stay hydrated during this time as well.  You may use things like Powerade, Gatorade, electrolyte powders and water.  Additionally, I sent the benzonatate pills to your pharmacy for cough  We hope that you feel better and do not hesitate to return to the emergency department with any worsening symptoms, especially chest pain, shortness of breath, dizziness and loss of consciousness.

## 2022-12-29 NOTE — ED Provider Notes (Signed)
[facility] EMERGENCY DEPARTMENT Provider Note   CSN: [id number] Arrival date & time: 12/29/22  4098     History No chief complaint on file.   Regina Mckee is a 26 y.o. female presenting with a week worth of URI symptoms.  Reports that her daughter is also sick.  Inclusive of rhinorrhea, cough and sore throat.  No trouble breathing or chest pain.  HPI     Home Medications Prior to Admission medications   Not on File      Allergies    Other    Review of Systems   Review of Systems  Physical Exam Updated Vital Signs BP (!) 129/105 (BP Location: Right Arm)   Pulse 79   Temp 98.6 F (37 C)   Resp 17   SpO2 98%  Physical Exam Vitals and nursing note reviewed.  Constitutional:      Appearance: Normal appearance.  HENT:     Head: Normocephalic and atraumatic.     Right Ear: Tympanic membrane normal.     Left Ear: Tympanic membrane normal.     Mouth/Throat:     Mouth: Mucous membranes are moist.     Pharynx: Oropharynx is clear. No oropharyngeal exudate or posterior oropharyngeal erythema.  Eyes:     General: No scleral icterus.    Conjunctiva/sclera: Conjunctivae normal.  Cardiovascular:     Rate and Rhythm: Normal rate and regular rhythm.  Pulmonary:     Effort: Pulmonary effort is normal. No respiratory distress.     Breath sounds: No wheezing or rales.  Skin:    General: Skin is warm and dry.     Findings: No rash.  Neurological:     Mental Status: She is alert.  Psychiatric:        Mood and Affect: Mood normal.     ED Results / Procedures / Treatments   Labs (all labs ordered are listed, but only abnormal results are displayed) Labs Reviewed  RESP PANEL BY RT-PCR (RSV, FLU A&B, COVID)  [id number]    EKG None  Radiology No results found.  Procedures Procedures   Medications Ordered in ED Medications - No data to display  ED Course/ Medical Decision Making/ [name]                           Medical Decision  Making  Healthy 27 year old presenting today with URI symptoms.  Symptoms have been going on for around a week.  On physical exam they are well-appearing.  Viral swab pending however patient would prefer to follow-up on this online.  We discussed that these viral illnesses are something that need to run their course and they may use ibuprofen, Tylenol, DayQuil/NyQuil and other over-the-counter medications for their symptoms.  Return precautions discussed and they understand that they may follow-up on their symptoms outpatient with either PCP or urgent care as needed for nonemergent symptoms.  Agreeable to discharge at this time.   I will send Tessalon to her pharmacy as well Final Clinical Impression(s) / ED Diagnoses Final diagnoses:  Flu-like symptoms    Rx / DC Orders ED Discharge Orders          Ordered    benzonatate (TESSALON) 100 MG capsule  Every 8 hours        12/29/22 0922           Results and diagnoses were explained to the patient. Return precautions discussed in full. Patient had no  additional questions and expressed complete understanding.   This chart was dictated using voice recognition software.  Despite best efforts to proofread,  errors can occur which can change the documentation meaning.     Woodroe Chen 12/29/22 6160    Linwood Dibbles, MD 12/30/22 [id number]

## 2022-12-29 NOTE — ED Triage Notes (Signed)
Pt here with cough, sneezing and runny nose for the last few days. Denies fevers.

## 2023-01-22 ENCOUNTER — Ambulatory Visit: Payer: Commercial Managed Care - HMO | Admitting: Advanced Practice Midwife

## 2023-02-25 ENCOUNTER — Ambulatory Visit (INDEPENDENT_AMBULATORY_CARE_PROVIDER_SITE_OTHER): Payer: Commercial Managed Care - HMO | Admitting: Internal Medicine

## 2023-02-25 ENCOUNTER — Encounter: Payer: Self-pay | Admitting: Internal Medicine

## 2023-02-25 VITALS — BP 120/80 | HR 76 | Temp 98.5°F | Ht 63.0 in | Wt 187.1 lb

## 2023-02-25 DIAGNOSIS — Z3042 Encounter for surveillance of injectable contraceptive: Secondary | ICD-10-CM | POA: Diagnosis not present

## 2023-02-25 DIAGNOSIS — E669 Obesity, unspecified: Secondary | ICD-10-CM | POA: Diagnosis not present

## 2023-02-25 MED ORDER — MEDROXYPROGESTERONE ACETATE 150 MG/ML IM SUSP
150.0000 mg | INTRAMUSCULAR | Status: AC
  Administered 2023-02-25: 150 mg via INTRAMUSCULAR

## 2023-02-25 NOTE — Addendum Note (Signed)
Addended by: Westley Hummer B on: 02/25/2023 04:11 PM   Modules accepted: Orders

## 2023-02-25 NOTE — Progress Notes (Signed)
New Patient Office Visit     CC/Reason for Visit: Establish care, contraceptive management Previous PCP: GYN Last Visit: September/2023  HPI: Regina Mckee is a 27 y.o. female who is coming in today for the above mentioned reasons. Past Medical History is significant for: Gestational hypertension.  She feels well has no acute concerns.  She was an occasional smoker but quit 18 months ago while pregnant.  She drinks alcohol occasionally, no allergies, no past surgical history.  Family significant for father with diabetes, hypertension, coronary artery disease and bipolar disorder.  She has been getting Depo Provera every 3 months as contraception.  She is due for her 90-day injection today.  She follows routinely with GYN, her last Pap smear was in April 2022.   Past Medical/Surgical History: Past Medical History:  Diagnosis Date   Asthma    Eczema    History of ADHD    Hypertension     Past Surgical History:  Procedure Laterality Date   NO PAST SURGERIES      Social History:  reports that she has been smoking cigarettes. She has been smoking an average of .25 packs per day. She has never used smokeless tobacco. She reports current alcohol use. She reports that she does not currently use drugs after having used the following drugs: Marijuana.  Allergies: Allergies  Allergen Reactions   Other Itching    Cherries cause throat to itch.    Family History:  Family History  Problem Relation Age of Onset   Stroke Other    Hypertension Other    Cancer Other    Diabetes Other    Hypertension Father    Diabetes Father      Current Outpatient Medications:    medroxyPROGESTERone (DEPO-PROVERA) 150 MG/ML injection, Inject 150 mg into the muscle every 3 (three) months., Disp: , Rfl:   Review of Systems:  Negative except as indicated in HPI.   Physical Exam: Vitals:   02/25/23 1534  BP: 120/80  Pulse: 76  Temp: 98.5 F (36.9 C)  TempSrc: Oral  SpO2:  98%  Weight: 187 lb 1.6 oz (84.9 kg)  Height: '5\' 3"'$  (1.6 m)   Body mass index is 33.14 kg/m.  Physical Exam Vitals reviewed.  Constitutional:      Appearance: Normal appearance.  HENT:     Head: Normocephalic and atraumatic.  Eyes:     Conjunctiva/sclera: Conjunctivae normal.     Pupils: Pupils are equal, round, and reactive to light.  Cardiovascular:     Rate and Rhythm: Normal rate and regular rhythm.  Pulmonary:     Effort: Pulmonary effort is normal.     Breath sounds: Normal breath sounds.  Skin:    General: Skin is warm and dry.  Neurological:     General: No focal deficit present.     Mental Status: She is alert and oriented to person, place, and time.  Psychiatric:        Mood and Affect: Mood normal.        Behavior: Behavior normal.        Thought Content: Thought content normal.        Judgment: Judgment normal.       Impression and Plan:  Encounter for surveillance of injectable contraceptive  Obesity (BMI 30.0-34.9)  -Discussed healthy lifestyle, including increased physical activity and better food choices to promote weight loss. -Depo-Provera administered today.  Since she has not missed doses, pregnancy test not performed today.  Time spent:  46 minutes reviewing chart, interviewing and examining patient and formulating plan of care.      Lelon Frohlich, MD [facility]

## 2023-05-11 ENCOUNTER — Emergency Department (HOSPITAL_COMMUNITY)
Admission: EM | Admit: 2023-05-11 | Discharge: 2023-05-11 | Disposition: A | Discharge status: Home / Self Care | Payer: Commercial Managed Care - HMO | Attending: Emergency Medicine | Admitting: Emergency Medicine

## 2023-05-11 ENCOUNTER — Encounter (HOSPITAL_COMMUNITY): Payer: Self-pay

## 2023-05-11 ENCOUNTER — Other Ambulatory Visit: Payer: Self-pay

## 2023-05-11 DIAGNOSIS — H7211 Attic perforation of tympanic membrane, right ear: Secondary | ICD-10-CM | POA: Diagnosis not present

## 2023-05-11 DIAGNOSIS — H9201 Otalgia, right ear: Secondary | ICD-10-CM | POA: Diagnosis present

## 2023-05-11 DIAGNOSIS — H6691 Otitis media, unspecified, right ear: Secondary | ICD-10-CM | POA: Diagnosis not present

## 2023-05-11 DIAGNOSIS — H6690 Otitis media, unspecified, unspecified ear: Secondary | ICD-10-CM

## 2023-05-11 DIAGNOSIS — H7291 Unspecified perforation of tympanic membrane, right ear: Secondary | ICD-10-CM

## 2023-05-11 MED ORDER — AMOXICILLIN 500 MG PO CAPS: 500.0000 mg | ORAL_CAPSULE | Freq: Two times a day (BID) | ORAL | 0 refills | Status: AC

## 2023-05-11 MED ORDER — AMOXICILLIN 500 MG PO CAPS
500.0000 mg | ORAL_CAPSULE | Freq: Once | ORAL | Status: AC
  Administered 2023-05-11: 500 mg via ORAL
  Filled 2023-05-11: qty 1

## 2023-05-11 NOTE — ED Triage Notes (Signed)
Pt arrives with right ear pain and fullness that started a week ago. Pt denies fevers. Pt endorses drainage.

## 2023-05-11 NOTE — Discharge Instructions (Signed)
Pleasure taking care of you here in the emergency department today  You likely had an ear infection and then using Q-tips because a perforation which is a hole in the inner membrane of your ear.  We have started you on antibiotics.  Take as prescribed.  Try not to get any water in your ear.  Tylenol Motrin as needed for pain.  Follow-up with the ear nose and throat providers listed on discharge paperwork for follow-up.  You will need to call to schedule appointment.  Return for new or worsening symptoms

## 2023-05-11 NOTE — ED Notes (Signed)
Patient verbalizes understanding of discharge instructions. Opportunity for questioning and answers were provided. Pt discharged from ED to home via POV. 

## 2023-05-11 NOTE — ED Provider Notes (Signed)
[place] EMERGENCY DEPARTMENT AT [facility] Provider Note   CSN: [id number] Arrival date & time: 05/11/23  1818     History  Chief Complaint  Patient presents with   Otalgia    Jasnoor T McGregor-Shabazz is a 27 y.o. female here for evaluation of right ear pain.  Pain x 1 week ago.  Had some fullness to the ear.  Used Q-tips and developed worsening pain.  States she is now having some drainage to the ear.  Some scant bright red blood in this.  No fever, tinnitus, neck pain.  No facial swelling.  No meds PTA. No recent swimming activities or possible water in ear  Eating chicken wings during my evaluation.  HPI     Home Medications Prior to Admission medications   Medication Sig Start Date End Date Taking? Authorizing Provider  amoxicillin (AMOXIL) 500 MG capsule Take 1 capsule (500 mg total) by mouth 2 (two) times daily for 5 days. 05/11/23 05/16/23 Yes Arlan Birks A, PA-C  medroxyPROGESTERone (DEPO-PROVERA) 150 MG/ML injection Inject 150 mg into the muscle every 3 (three) months.    [provider]      Allergies    Other    Review of Systems   Review of Systems  HENT:  Positive for ear discharge and ear pain. Negative for facial swelling.   Respiratory: Negative.    Cardiovascular: Negative.   Gastrointestinal: Negative.   Genitourinary: Negative.   Musculoskeletal: Negative.   Skin: Negative.   Neurological: Negative.   All other systems reviewed and are negative.   Physical Exam Updated Vital Signs BP 118/72   Pulse 85   Temp 98.1 F (36.7 C) (Oral)   Resp 16   Ht 5\' 3"  (1.6 m)   Wt 82.6 kg   SpO2 99%   BMI 32.24 kg/m  Physical Exam Vitals and nursing note reviewed.  Constitutional:      General: She is not in acute distress.    Appearance: She is well-developed. She is not ill-appearing, toxic-appearing or diaphoretic.  HENT:     Head: Normocephalic and atraumatic.     Jaw: There is normal jaw occlusion.     Comments:  No facial swelling, erythema, warmth no drooling, dysphagia or trismus    Ears:     Comments: Left TM with moderate cerumen however no impaction.  TM clear.  No drainage Dried yellow drainage right mid ear canal, perforated TM.  No foreign object nontender tragus, retraction the pinna.  No erythema, tenderness over mastoid bilaterally    Nose: Nose normal.     Mouth/Throat:     Mouth: Mucous membranes are moist.     Comments: Eating chicken wings during my evaluation Eyes:     Pupils: Pupils are equal, round, and reactive to light.  Cardiovascular:     Rate and Rhythm: Normal rate.  Pulmonary:     Effort: No respiratory distress.  Abdominal:     General: There is no distension.  Musculoskeletal:        General: Normal range of motion.     Cervical back: Normal range of motion.  Skin:    General: Skin is warm and dry.  Neurological:     General: No focal deficit present.     Mental Status: She is alert.  Psychiatric:        Mood and Affect: Mood normal.     ED Results / Procedures / Treatments   Labs (all labs ordered are listed, but  only abnormal results are displayed) Labs Reviewed - No data to display  EKG None  Radiology No results found.  Procedures Procedures    Medications Ordered in ED Medications  amoxicillin (AMOXIL) capsule 500 mg (500 mg Oral Given 05/11/23 2020)    ED Course/ Medical Decision Making/ [name]   27 year old here for evaluation of right ear pain over the last week.  Sounds like she had some fullness to her right ear as well as pain.  Use a Q-tip noted the next day she had yellow drainage to right ear with some scant bleeding.  She is a nonfocal neuroexam.  She has no facial swelling.  No changes in hearing.  No tenderness.  Eating chicken wings on my initial evaluation.  Left ear shows some moderate cerumen however no impaction.  TM clear.  Right TM perforated with yellow dried drainage mid ear canal.  Nontender mastoid bilaterally without  overlying skin changes.  Suspect she likely had otitis media which possibly ruptured.  Will treat with antibiotics.  Low suspicion for acute mastoiditis, meningitis, deep space infection.  Discussed close follow-up with ENT, OTC meds as needed for pain.  The patient has been appropriately medically screened and/or stabilized in the ED. I have low suspicion for any other emergent medical condition which would require further screening, evaluation or treatment in the ED or require inpatient management.  Patient is hemodynamically stable and in no acute distress.  Patient able to ambulate in department prior to ED.  Evaluation does not show acute pathology that would require ongoing or additional emergent interventions while in the emergency department or further inpatient treatment.  I have discussed the diagnosis with the patient and answered all questions.  Pain is been managed while in the emergency department and patient has no further complaints prior to discharge.  Patient is comfortable with plan discussed in room and is stable for discharge at this time.  I have discussed strict return precautions for returning to the emergency department.  Patient was encouraged to follow-up with PCP/specialist refer to at discharge.                              Medical Decision Making Amount and/or Complexity of Data Reviewed Independent Historian: friend External Data Reviewed: labs and notes.  Risk OTC drugs. Prescription drug management. Diagnosis or treatment significantly limited by social determinants of health.          Final Clinical Impression(s) / ED Diagnoses Final diagnoses:  Perforation of right tympanic membrane  Acute otitis media, unspecified otitis media type    Rx / DC Orders ED Discharge Orders          Ordered    amoxicillin (AMOXIL) 500 MG capsule  2 times daily        05/11/23 2014              Ellamarie Naeve A, PA-C 05/11/23 2023    Linwood Dibbles,  MD 05/13/23 [phone]

## 2023-05-28 ENCOUNTER — Ambulatory Visit: Payer: Commercial Managed Care - HMO

## 2023-05-29 ENCOUNTER — Ambulatory Visit (INDEPENDENT_AMBULATORY_CARE_PROVIDER_SITE_OTHER): Payer: Commercial Managed Care - HMO | Admitting: *Deleted

## 2023-05-29 DIAGNOSIS — Z3200 Encounter for pregnancy test, result unknown: Secondary | ICD-10-CM

## 2023-05-29 DIAGNOSIS — Z3042 Encounter for surveillance of injectable contraceptive: Secondary | ICD-10-CM

## 2023-05-29 LAB — POCT URINE PREGNANCY: Preg Test, Ur: NEGATIVE

## 2023-05-29 MED ORDER — MEDROXYPROGESTERONE ACETATE 150 MG/ML IM SUSP
150.0000 mg | INTRAMUSCULAR | Status: AC
Start: 2023-05-29 — End: ?
  Administered 2023-05-29: 150 mg via INTRAMUSCULAR

## 2023-05-29 NOTE — Progress Notes (Signed)
Per orders of Dr. Ardyth Harps, injection of Medroxyprogesterone acetate 150mg  given by Johnella Moloney. Patient tolerated injection well. POC pregnancy test was performed with negative results as patient is 2 days past due for the injection.  Patient was given a reminder note to complete the next injection on 8/21-9/4.

## 2023-07-17 ENCOUNTER — Encounter: Payer: Self-pay | Admitting: Internal Medicine

## 2023-07-17 ENCOUNTER — Ambulatory Visit (INDEPENDENT_AMBULATORY_CARE_PROVIDER_SITE_OTHER): Payer: Commercial Managed Care - HMO | Admitting: Internal Medicine

## 2023-07-17 VITALS — BP 120/84 | Temp 98.4°F | Wt 181.3 lb

## 2023-07-17 DIAGNOSIS — K5900 Constipation, unspecified: Secondary | ICD-10-CM | POA: Diagnosis not present

## 2023-07-17 DIAGNOSIS — B354 Tinea corporis: Secondary | ICD-10-CM

## 2023-07-17 MED ORDER — TERBINAFINE HCL 1 % EX CREA
1.0000 | TOPICAL_CREAM | Freq: Two times a day (BID) | CUTANEOUS | 0 refills | Status: AC
Start: 2023-07-17 — End: ?

## 2023-07-17 NOTE — Progress Notes (Signed)
     Established Patient Office Visit     CC/Reason for Visit: Discuss acute concerns  HPI: Regina Mckee is a 27 y.o. female who is coming in today for the above mentioned reasons.  Here to discuss 2 issues:  1.  She has a rash on the inside of her forearm, been present for couple weeks.  Is a little pruritic.  2.  She has been very constipated.  Feels bloated.  Has been having watery stool daily.   Past Medical/Surgical History: Past Medical History:  Diagnosis Date   Asthma    Eczema    History of ADHD    Hypertension     Past Surgical History:  Procedure Laterality Date   NO PAST SURGERIES      Social History:  reports that she has been smoking cigarettes. She has never used smokeless tobacco. She reports current alcohol use. She reports that she does not currently use drugs after having used the following drugs: Marijuana.  Allergies: Allergies  Allergen Reactions   Other Itching    Cherries cause throat to itch.    Family History:  Family History  Problem Relation Age of Onset   Stroke Other    Hypertension Other    Cancer Other    Diabetes Other    Hypertension Father    Diabetes Father      Current Outpatient Medications:    medroxyPROGESTERone (DEPO-PROVERA) 150 MG/ML injection, Inject 150 mg into the muscle every 3 (three) months., Disp: , Rfl:    terbinafine (LAMISIL) 1 % cream, Apply 1 Application topically 2 (two) times daily., Disp: 30 g, Rfl: 0  Current Facility-Administered Medications:    medroxyPROGESTERone (DEPO-PROVERA) injection 150 mg, 150 mg, Intramuscular, Q90 days, Philip Aspen, Limmie Patricia, MD, 150 mg at 02/25/23 1609   medroxyPROGESTERone (DEPO-PROVERA) injection 150 mg, 150 mg, Intramuscular, Q90 days, Philip Aspen, Limmie Patricia, MD, 150 mg at 05/29/23 1524  Review of Systems:  Negative unless indicated in HPI.   Physical Exam: Vitals:   07/17/23 1557  BP: 120/84  Temp: 98.4 F (36.9 C)  TempSrc: Oral   Weight: 181 lb 4.8 oz (82.2 kg)    Body mass index is 32.12 kg/m.   Physical Exam Skin:    Comments: Round lesion with erythematous center and scaly border on the inside of her left forearm     Impression and Plan:  Ringworm of body -     Terbinafine HCl; Apply 1 Application topically 2 (two) times daily.  Dispense: 30 g; Refill: 0  Constipation, unspecified constipation type  -Terbinafine cream 3 times a day for her tinea corporis. -For constipation we discussed increasing water intake, adding MiraLAX and Colace daily.  Goal for soft bowel movement at least every other day.  Suspect she is having leakage of liquid stool due to significant fecal ball.   Time spent:30 minutes reviewing chart, interviewing and examining patient and formulating plan of care.     Chaya Jan, MD [facility]

## 2023-07-24 ENCOUNTER — Encounter (INDEPENDENT_AMBULATORY_CARE_PROVIDER_SITE_OTHER): Payer: Self-pay

## 2023-08-15 ENCOUNTER — Ambulatory Visit (INDEPENDENT_AMBULATORY_CARE_PROVIDER_SITE_OTHER): Payer: Commercial Managed Care - HMO

## 2023-08-15 DIAGNOSIS — Z3042 Encounter for surveillance of injectable contraceptive: Secondary | ICD-10-CM

## 2023-08-15 MED ORDER — MEDROXYPROGESTERONE ACETATE 150 MG/ML IM SUSY
150.0000 mg | PREFILLED_SYRINGE | INTRAMUSCULAR | Status: AC
Start: 2023-08-15 — End: 2023-08-15
  Administered 2023-08-15: 150 mg via INTRAMUSCULAR

## 2023-08-15 NOTE — Progress Notes (Signed)
Per orders of Dr. Ardyth Harps, injection of MedroxyPROGESTERone Acetate inj. 150mg /mL  given by Vickii Chafe on Left Ventrogluteal . Patient tolerated injection well. Patient was given a reminder note to complete the next injection on Nov 7-Nov 21.

## 2023-10-01 ENCOUNTER — Ambulatory Visit (INDEPENDENT_AMBULATORY_CARE_PROVIDER_SITE_OTHER): Payer: Commercial Managed Care - HMO | Admitting: Internal Medicine

## 2023-10-01 ENCOUNTER — Encounter: Payer: Self-pay | Admitting: Internal Medicine

## 2023-10-01 VITALS — BP 118/74 | HR 81 | Temp 98.3°F | Ht 63.0 in | Wt 188.7 lb

## 2023-10-01 DIAGNOSIS — B354 Tinea corporis: Secondary | ICD-10-CM

## 2023-10-01 DIAGNOSIS — Z23 Encounter for immunization: Secondary | ICD-10-CM

## 2023-10-01 NOTE — Progress Notes (Signed)
     Established Patient Office Visit     CC/Reason for Visit: Rash, requesting immunizations  HPI: Regina Mckee is a 27 y.o. female who is coming in today for the above mentioned reasons.  She was diagnosed with a ringworm lesion of her forearm.  This has resolved with terbinafine cream.  She has had at least 3 similar lesions appear on the inside of both thighs.  She is also requesting flu and HPV vaccines.   Past Medical/Surgical History: Past Medical History:  Diagnosis Date   Asthma    Eczema    History of ADHD    Hypertension     Past Surgical History:  Procedure Laterality Date   NO PAST SURGERIES      Social History:  reports that she has been smoking cigarettes. She has never used smokeless tobacco. She reports current alcohol use. She reports that she does not currently use drugs after having used the following drugs: Marijuana.  Allergies: Allergies  Allergen Reactions   Other Itching    Cherries cause throat to itch.    Family History:  Family History  Problem Relation Age of Onset   Stroke Other    Hypertension Other    Cancer Other    Diabetes Other    Hypertension Father    Diabetes Father      Current Outpatient Medications:    medroxyPROGESTERone (DEPO-PROVERA) 150 MG/ML injection, Inject 150 mg into the muscle every 3 (three) months., Disp: , Rfl:    terbinafine (LAMISIL) 1 % cream, Apply 1 Application topically 2 (two) times daily., Disp: 30 g, Rfl: 0  Current Facility-Administered Medications:    medroxyPROGESTERone (DEPO-PROVERA) injection 150 mg, 150 mg, Intramuscular, Q90 days, Philip Aspen, Limmie Patricia, MD, 150 mg at 02/25/23 1609   medroxyPROGESTERone (DEPO-PROVERA) injection 150 mg, 150 mg, Intramuscular, Q90 days, Philip Aspen, Limmie Patricia, MD, 150 mg at 05/29/23 1524  Review of Systems:  Negative unless indicated in HPI.   Physical Exam: Vitals:   10/01/23 1310  BP: 118/74  Pulse: 81  Temp: 98.3 F (36.8  C)  TempSrc: Oral  SpO2: 98%  Weight: 188 lb 11.2 oz (85.6 kg)  Height: 5\' 3"  (1.6 m)    Body mass index is 33.43 kg/m.   Physical Exam Skin:    Comments: Small erythematous rings with an outer ring of scaly tissue on the inside of her thigh.  She has at least 3 small lesions.      Impression and Plan:  Ringworm of body  Immunization due  -Flu and first HPV vaccine administered today. -Continue with terbinafine cream twice daily to affected areas.   Time spent:23 minutes reviewing chart, interviewing and examining patient and formulating plan of care.     Chaya Jan, MD [facility]

## 2023-11-01 ENCOUNTER — Ambulatory Visit: Payer: Managed Care, Other (non HMO)

## 2023-11-05 ENCOUNTER — Ambulatory Visit: Payer: No Typology Code available for payment source

## 2023-12-02 ENCOUNTER — Ambulatory Visit: Payer: Commercial Managed Care - HMO

## 2024-06-01 ENCOUNTER — Ambulatory Visit (INDEPENDENT_AMBULATORY_CARE_PROVIDER_SITE_OTHER): Payer: Commercial Managed Care - HMO

## 2024-06-01 DIAGNOSIS — Z23 Encounter for immunization: Secondary | ICD-10-CM | POA: Diagnosis not present

## 2024-06-01 NOTE — Progress Notes (Signed)
 Patient is in office today for a nurse visit for HPV Immunization. Patient Injection was given in the  Left deltoid. Patient tolerated injection well.

## 2024-08-03 ENCOUNTER — Ambulatory Visit: Payer: Self-pay | Admitting: *Deleted

## 2024-08-03 NOTE — Telephone Encounter (Signed)
Has appt 8/12

## 2024-08-03 NOTE — Telephone Encounter (Signed)
 Copied from CRM #[id number]. Topic: Clinical - Red Word Triage >> Aug 03, 2024 11:49 AM Viola FALCON wrote: Red Word that prompted transfer to Nurse Triage: Patient having throat pain, cough and mucus Reason for Disposition  [1] Continuous (nonstop) coughing interferes with work or school AND [2] no improvement using cough treatment per Care Advice  Answer Assessment - Initial Assessment Questions 1. ONSET: When did the cough begin?      Coughing up thick, yellow brown mucus that started 2 weeks ago.   My daughter came home with a viral infection and passed along to me. 2. SEVERITY: How bad is the cough today?      I'm coughing a lot 3. SPUTUM: Describe the color of your sputum (e.g., none, dry cough; clear, white, yellow, green)     See above 4. HEMOPTYSIS: Are you coughing up any blood? If Yes, ask: How much? (e.g., flecks, streaks, tablespoons, etc.)     Not asked 5. DIFFICULTY BREATHING: Are you having difficulty breathing? If Yes, ask: How bad is it? (e.g., mild, moderate, severe)      Shortness of breath, cold sweats, headache and pressure that won't go away.    The back of my throat top is where the pain and pressure is.   When I blow my nose I get a little bit out out.    6. FEVER: Do you have a fever? If Yes, ask: What is your temperature, how was it measured, and when did it start?     I tried Claritin, ibuprofen , COld and FLu, and Tussin and Dayquil and Nyquil and nothing is helping.  No fever now but I did in the beginning. The cough and my throat is so bad. 7. CARDIAC HISTORY: Do you have any history of heart disease? (e.g., heart attack, congestive heart failure)      Not asked 8. LUNG HISTORY: Do you have any history of lung disease?  (e.g., pulmonary embolus, asthma, emphysema)     I have asthma.    9. PE RISK FACTORS: Do you have a history of blood clots? (or: recent major surgery, recent prolonged travel, bedridden)     Not asked 10. OTHER SYMPTOMS: Do  you have any other symptoms? (e.g., runny nose, wheezing, chest pain)       See above.    11. PREGNANCY: Is there any chance you are pregnant? When was your last menstrual period?       Not asked 12. TRAVEL: Have you traveled out of the country in the last month? (e.g., travel history, exposures)       N/A  Protocols used: Cough - Acute Productive-A-AH FYI Only or Action Required?: FYI only for provider.  Patient was last seen in primary care on 10/01/2023 by Theophilus Andrews, Tully GRADE, MD.  Called Nurse Triage reporting Cough. Sore throat.   Productive cough.   Got a viral infection from her daughter who is in day care.  Symptoms began several weeks ago. 2 weeks not  Has asthma. Cough not getting better.    Interventions attempted: OTC medications: multiple OTC medications without relief.  Symptoms are: gradually worsening.  Triage Disposition: See Physician Within 24 Hours  Patient/caregiver understands and will follow disposition?: Yes

## 2024-08-04 ENCOUNTER — Ambulatory Visit: Admitting: Adult Health

## 2024-08-04 VITALS — BP 120/80 | HR 54 | Temp 98.4°F | Ht 63.0 in | Wt 183.0 lb

## 2024-08-04 DIAGNOSIS — J988 Other specified respiratory disorders: Secondary | ICD-10-CM | POA: Diagnosis not present

## 2024-08-04 MED ORDER — AMOXICILLIN-POT CLAVULANATE 875-125 MG PO TABS: 1.0000 | ORAL_TABLET | Freq: Two times a day (BID) | ORAL | 0 refills | Status: DC

## 2024-08-04 MED ORDER — ALBUTEROL SULFATE HFA 108 (90 BASE) MCG/ACT IN AERS
2.0000 | INHALATION_SPRAY | Freq: Four times a day (QID) | RESPIRATORY_TRACT | 0 refills | Status: AC | PRN
Start: 2024-08-04 — End: ?

## 2024-08-04 NOTE — Progress Notes (Signed)
 Subjective:    Patient ID: Regina Mckee, female    DOB: [birth year], 28 y.o.   MRN: [id number]  HPI  28 year old female who  has a past medical history of Asthma, Eczema, History of ADHD, and Hypertension.  She presents to the office today for an acute issue.  She reports that she has been sick for the last 12 to 14 days.  Believes that her 80-year-old daughter brought something home from school.  Symptoms include up productive cough with green mucus, sore throat, decreased appetite, headache, sinus pain and pressure and rhinorrhea with discolored mucus.  At home she has tried using DayQuil/NyQuil, Tylenol , saline spray, Tessalon , Zyrtec, and Claritin without relief.   Review of Systems See HPI   Past Medical History:  Diagnosis Date   Asthma    Eczema    History of ADHD    Hypertension     Social History   Socioeconomic History   Marital status: Single    Spouse name: Not on file   Number of children: Not on file   Years of education: Not on file   Highest education level: Not on file  Occupational History   Not on file  Tobacco Use   Smoking status: Some Days    Current packs/day: 0.25    Types: Cigarettes   Smokeless tobacco: Never  Vaping Use   Vaping status: Never Used  Substance and Sexual Activity   Alcohol use: Yes    Comment: occasional   Drug use: Not Currently    Types: Marijuana   Sexual activity: Yes    Birth control/protection: Injection  Other Topics Concern   Not on file  Social History Narrative   Not on file   Social Drivers of Health   Financial Resource Strain: Not on file  Food Insecurity: No Food Insecurity (12/05/2021)   Hunger Vital Sign    Worried About Running Out of Food in the Last Year: Never true    Ran Out of Food in the Last Year: Never true  Transportation Needs: No Transportation Needs (12/05/2021)   PRAPARE - Administrator, Civil Service (Medical): No    Lack of Transportation (Non-Medical):  No  Physical Activity: Not on file  Stress: Not on file  Social Connections: Not on file  Intimate Partner Violence: Not on file    Past Surgical History:  Procedure Laterality Date   NO PAST SURGERIES      Family History  Problem Relation Age of Onset   Stroke Other    Hypertension Other    Cancer Other    Diabetes Other    Hypertension Father    Diabetes Father     Allergies  Allergen Reactions   Other Itching    Cherries cause throat to itch.    Current Outpatient Medications on File Prior to Visit  Medication Sig Dispense Refill   medroxyPROGESTERone  (DEPO-PROVERA ) 150 MG/ML injection Inject 150 mg into the muscle every 3 (three) months.     terbinafine  (LAMISIL ) 1 % cream Apply 1 Application topically 2 (two) times daily. 30 g 0   Current Facility-Administered Medications on File Prior to Visit  Medication Dose Route Frequency Provider Last Rate Last Admin   medroxyPROGESTERone  (DEPO-PROVERA ) injection 150 mg  150 mg Intramuscular Q90 days Theophilus Andrews, Tully GRADE, MD   150 mg at 02/25/23 1609   medroxyPROGESTERone  (DEPO-PROVERA ) injection 150 mg  150 mg Intramuscular Q90 days Theophilus Andrews, Tully GRADE, MD   150  mg at 05/29/23 1524    BP 120/80   Pulse (!) 54   Temp 98.4 F (36.9 C) (Oral)   Ht 5' 3 (1.6 m)   Wt 183 lb (83 kg)   SpO2 97%   BMI 32.42 kg/m       Objective:   Physical Exam Vitals and nursing note reviewed.  Constitutional:      Appearance: Normal appearance.  HENT:     Nose:     Right Turbinates: Enlarged and swollen.     Left Turbinates: Enlarged and swollen.     Right Sinus: Maxillary sinus tenderness present. No frontal sinus tenderness.     Left Sinus: Maxillary sinus tenderness present. No frontal sinus tenderness.     Mouth/Throat:     Pharynx: Oropharynx is clear.     Tonsils: No tonsillar exudate or tonsillar abscesses.  Cardiovascular:     Rate and Rhythm: Normal rate and regular rhythm.     Pulses: Normal pulses.      Heart sounds: Normal heart sounds.  Pulmonary:     Effort: Pulmonary effort is normal.     Breath sounds: Wheezing (trace wheezing throughout) present.  Musculoskeletal:        General: Normal range of motion.  Skin:    General: Skin is warm and dry.  Neurological:     Mental Status: She is alert and oriented to person, place, and time.  Psychiatric:        Mood and Affect: Mood normal.        Behavior: Behavior normal.        Thought Content: Thought content normal.        Judgment: Judgment normal.        Assessment & Plan:  1. Respiratory infection (Primary) - Will send in Augmentin  for suspected sinusitis and albuterol  inhaler for her wheezing.  She was encouraged to stay hydrated, rest, and follow-up if no improvement in the next 2 to 3 days. - amoxicillin -clavulanate (AUGMENTIN ) 875-125 MG tablet; Take 1 tablet by mouth 2 (two) times daily.  Dispense: 20 tablet; Refill: 0 - albuterol  (VENTOLIN  HFA) 108 (90 Base) MCG/ACT inhaler; Inhale 2 puffs into the lungs every 6 (six) hours as needed for wheezing or shortness of breath.  Dispense: 8 g; Refill: 0  Darleene Shape, NP  I personally spent a total of 31 minutes in the care of the patient today including preparing to see the patient, getting/reviewing separately obtained history, counseling and educating, documenting clinical information in the EHR, and communicating results.

## 2024-12-07 ENCOUNTER — Ambulatory Visit

## 2025-01-06 ENCOUNTER — Ambulatory Visit (INDEPENDENT_AMBULATORY_CARE_PROVIDER_SITE_OTHER): Admitting: Family Medicine

## 2025-01-06 ENCOUNTER — Encounter: Payer: Self-pay | Admitting: Family Medicine

## 2025-01-06 ENCOUNTER — Ambulatory Visit: Payer: Self-pay

## 2025-01-06 VITALS — BP 110/68 | HR 68 | Temp 98.0°F | Ht 63.0 in | Wt 170.0 lb

## 2025-01-06 DIAGNOSIS — H6501 Acute serous otitis media, right ear: Secondary | ICD-10-CM

## 2025-01-06 MED ORDER — AMOXICILLIN-POT CLAVULANATE 875-125 MG PO TABS: 1.0000 | ORAL_TABLET | Freq: Two times a day (BID) | ORAL | 0 refills | Status: AC

## 2025-01-06 NOTE — Telephone Encounter (Signed)
 FYI Only or Action Required?: FYI only for provider: appointment scheduled on 1/14.  Patient was last seen in primary care on 08/04/2024 by Merna Huxley, NP.  Called Nurse Triage reporting Otalgia.  Symptoms began yesterday.  Interventions attempted: OTC medications: pain med.  Symptoms are: gradually worsening.  Triage Disposition: See Physician Within 24 Hours  Patient/caregiver understands and will follow disposition?: Yes  Copied from CRM #[id number]. Topic: Clinical - Red Word Triage >> Jan 06, 2025  8:04 AM Deleta RAMAN wrote: Red Word that prompted transfer to Nurse Triage: serve pain right ear taking over face Reason for Disposition  White, yellow, or green discharge (pus)  Answer Assessment - Initial Assessment Questions 1. LOCATION: Which ear is involved?     Rt 2. ONSET: When did the ear pain start?      yesterday 3. SEVERITY: How bad is the pain?  (Scale 1-10; mild, moderate or severe)     severe 4. URI SYMPTOMS: Do you have a runny nose or cough?     Has had cold for a week 5. FEVER: Do you have a fever? If Yes, ask: What is your temperature, how was it measured, and when did it start?     Denies fever 6. CAUSE: Have you been swimming recently?, How often do you use Q-TIPS?, Have you had any recent air travel or scuba diving?      7. OTHER SYMPTOMS: Do you have any other symptoms? (e.g., decreased hearing, dizziness, headache, stiff neck, vomiting)     Pain in that side of face, clear/ white drainage 8. PREGNANCY: Is there any chance you are pregnant? When was your last menstrual period?  Protocols used: [place]illa

## 2025-01-06 NOTE — Progress Notes (Signed)
" ° °  Acute Office Visit   Subjective:  Patient ID: Regina Mckee, female    DOB: [birth year], 29 y.o.   MRN: [id number]  Chief Complaint  Patient presents with   Ear Pain    Right ear pain   HPI Regina Mckee presents to the clinic for acute symptoms of right ear symptoms. Her primary provider is Tully Nap, MD. Reports that she started having symptoms last night. Sharp and throbbing pain, as if a bee stung her in the ear. Has noticed draining that is yellow to clear fluid. Has tried a hot compress that has somewhat helped. On and off change of hearing/loss of hearing as if having water in her ear. Denies any N/V, dizziness, and fever.   She report she took Ibuprofen  200mg  but had not helped.   Review of Systems  Constitutional: Negative.   HENT:  Positive for ear discharge, ear pain and hearing loss.   Eyes: Negative.   Respiratory: Negative.    Cardiovascular: Negative.   Gastrointestinal: Negative.   Genitourinary: Negative.   Musculoskeletal: Negative.   Skin: Negative.   Neurological: Negative.   Psychiatric/Behavioral: Negative.        Objective:    BP 110/68   Pulse 68   Temp 98 F (36.7 C) (Oral)   Ht 5' 3 (1.6 m)   Wt 170 lb (77.1 kg)   SpO2 99%   BMI 30.11 kg/m    Physical Exam Constitutional:      Appearance: Normal appearance.  HENT:     Right Ear: External ear normal. Drainage, swelling and tenderness present. Tympanic membrane is erythematous.     Left Ear: Tympanic membrane, ear canal and external ear normal.     Ears:     Comments: R ear tympanic membrane is red and swollen Skin:    General: Skin is warm and dry.  Neurological:     General: No focal deficit present.     Mental Status: She is alert and oriented to person, place, and time. Mental status is at baseline.  Psychiatric:        Mood and Affect: Mood normal.        Behavior: Behavior normal.        Thought Content: Thought content normal.        Judgment: Judgment  normal.      Assessment & Plan:  Non-recurrent acute serous otitis media of right ear -     Amoxicillin -Pot Clavulanate; Take 1 tablet by mouth 2 (two) times daily for 7 days.  Dispense: 14 tablet; Refill: 0  -Discussed and examined probable causes for acute right ear symptoms, it appears a right ear infection. -Prescribed Augmentin  800/125 mg 1 tablet twice a day for 7 days. Sent to pharmacy. Sometimes Augmentin  can cause some stomach upset, can take with food. -Can continue warm compresses for 20 minutes a day, up to 4 times a day. -Can take over the counter Tylenol  1000 mg every 8 hours for pain as needed. Can alternate with Ibuprofen  600-800 mg as well. Eat a small snack when taking Ibuprofen .  -Written work note. -If symptoms do not improve or worsen, please follow up with primary care provider.  JoAnna Williamson, NP  "

## 2025-01-06 NOTE — Telephone Encounter (Signed)
 Noted

## 2025-01-06 NOTE — Patient Instructions (Addendum)
 It was nice to meet you today, I hope you get to feeling better soon! Today we: -Discussed and examined probable causes for acute right ear symptoms, it appears a right ear infection. -Prescribed Augmentin  800/125 mg 1 tablet twice a day for 7 days. Sent to pharmacy. Sometimes Augmentin  can cause some stomach upset, can take with food. -Can continue warm compresses for 20 minutes a day, up to 4 times a day. -Can take over the counter Tylenol  1000 mg every 8 hours for pain as needed. Can alternate with Ibuprofen  600-800 mg as well. Eat a small snack when taking Ibuprofen .  -Written work note. -If symptoms do not improve or worsen, please follow up with primary care provider.
